# Patient Record
Sex: Female | Born: 1940 | Race: Black or African American | Hispanic: No | Marital: Single | Smoking: Current every day smoker
Health system: Southern US, Community
[De-identification: ages and names within clinical notes are randomized; demographics above are authoritative.]

## PROBLEM LIST (undated history)

## (undated) DIAGNOSIS — E1049 Type 1 diabetes mellitus with other diabetic neurological complication: Secondary | ICD-10-CM

## (undated) DIAGNOSIS — E1065 Type 1 diabetes mellitus with hyperglycemia: Secondary | ICD-10-CM

## (undated) DIAGNOSIS — I509 Heart failure, unspecified: Secondary | ICD-10-CM

## (undated) DIAGNOSIS — I10 Essential (primary) hypertension: Secondary | ICD-10-CM

## (undated) DIAGNOSIS — E039 Hypothyroidism, unspecified: Secondary | ICD-10-CM

## (undated) DIAGNOSIS — N183 Chronic kidney disease, stage 3 unspecified: Secondary | ICD-10-CM

## (undated) DIAGNOSIS — R609 Edema, unspecified: Secondary | ICD-10-CM

## (undated) DIAGNOSIS — I639 Cerebral infarction, unspecified: Secondary | ICD-10-CM

## (undated) DIAGNOSIS — F329 Major depressive disorder, single episode, unspecified: Secondary | ICD-10-CM

## (undated) DIAGNOSIS — D649 Anemia, unspecified: Secondary | ICD-10-CM

## (undated) DIAGNOSIS — K5900 Constipation, unspecified: Secondary | ICD-10-CM

## (undated) DIAGNOSIS — M810 Age-related osteoporosis without current pathological fracture: Secondary | ICD-10-CM

## (undated) DIAGNOSIS — F3289 Other specified depressive episodes: Secondary | ICD-10-CM

## (undated) DIAGNOSIS — G609 Hereditary and idiopathic neuropathy, unspecified: Secondary | ICD-10-CM

## (undated) DIAGNOSIS — I6990 Unspecified sequelae of unspecified cerebrovascular disease: Secondary | ICD-10-CM

## (undated) DIAGNOSIS — G8190 Hemiplegia, unspecified affecting unspecified side: Secondary | ICD-10-CM

## (undated) DIAGNOSIS — N289 Disorder of kidney and ureter, unspecified: Secondary | ICD-10-CM

## (undated) DIAGNOSIS — E11329 Type 2 diabetes mellitus with mild nonproliferative diabetic retinopathy without macular edema: Secondary | ICD-10-CM

## (undated) DIAGNOSIS — E042 Nontoxic multinodular goiter: Secondary | ICD-10-CM

## (undated) DIAGNOSIS — I110 Hypertensive heart disease with heart failure: Secondary | ICD-10-CM

## (undated) DIAGNOSIS — E559 Vitamin D deficiency, unspecified: Secondary | ICD-10-CM

## (undated) HISTORY — DX: Chronic kidney disease, stage 3 unspecified: N18.30

## (undated) HISTORY — DX: Anemia, unspecified: D64.9

## (undated) HISTORY — DX: Unspecified sequelae of unspecified cerebrovascular disease: I69.90

## (undated) HISTORY — DX: Other specified depressive episodes: F32.89

## (undated) HISTORY — DX: Type 1 diabetes mellitus with other diabetic neurological complication: E10.49

## (undated) HISTORY — DX: Vitamin D deficiency, unspecified: E55.9

## (undated) HISTORY — DX: Chronic kidney disease, stage 3 (moderate): N18.3

## (undated) HISTORY — PX: CEREBRAL ANEURYSM REPAIR: SHX164

## (undated) HISTORY — DX: Nontoxic multinodular goiter: E04.2

## (undated) HISTORY — DX: Hemiplegia, unspecified affecting unspecified side: G81.90

## (undated) HISTORY — DX: Disorder of kidney and ureter, unspecified: N28.9

## (undated) HISTORY — DX: Hypertensive heart disease with heart failure: I11.0

## (undated) HISTORY — DX: Hypothyroidism, unspecified: E03.9

## (undated) HISTORY — DX: Essential (primary) hypertension: I10

## (undated) HISTORY — DX: Heart failure, unspecified: I50.9

## (undated) HISTORY — DX: Age-related osteoporosis without current pathological fracture: M81.0

## (undated) HISTORY — DX: Type 2 diabetes mellitus with mild nonproliferative diabetic retinopathy without macular edema: E11.329

## (undated) HISTORY — DX: Edema, unspecified: R60.9

## (undated) HISTORY — DX: Hereditary and idiopathic neuropathy, unspecified: G60.9

## (undated) HISTORY — DX: Constipation, unspecified: K59.00

## (undated) HISTORY — DX: Type 1 diabetes mellitus with hyperglycemia: E10.65

## (undated) HISTORY — DX: Major depressive disorder, single episode, unspecified: F32.9

---

## 2008-05-19 ENCOUNTER — Emergency Department (HOSPITAL_COMMUNITY): Admission: EM | Admit: 2008-05-19 | Discharge: 2008-05-19 | Payer: Self-pay | Admitting: Emergency Medicine

## 2008-07-08 ENCOUNTER — Other Ambulatory Visit: Admission: RE | Admit: 2008-07-08 | Discharge: 2008-07-08 | Payer: Self-pay | Admitting: Interventional Radiology

## 2008-07-08 ENCOUNTER — Encounter (INDEPENDENT_AMBULATORY_CARE_PROVIDER_SITE_OTHER): Payer: Self-pay | Admitting: Interventional Radiology

## 2008-07-08 ENCOUNTER — Encounter: Admission: RE | Admit: 2008-07-08 | Discharge: 2008-07-08 | Payer: Self-pay | Admitting: Internal Medicine

## 2009-03-22 HISTORY — PX: EYE SURGERY: SHX253

## 2010-03-02 ENCOUNTER — Encounter
Admission: RE | Admit: 2010-03-02 | Discharge: 2010-03-02 | Payer: Self-pay | Source: Home / Self Care | Attending: Internal Medicine | Admitting: Internal Medicine

## 2010-03-15 ENCOUNTER — Encounter
Admission: RE | Admit: 2010-03-15 | Discharge: 2010-03-15 | Payer: Self-pay | Source: Home / Self Care | Attending: Internal Medicine | Admitting: Internal Medicine

## 2010-03-20 ENCOUNTER — Encounter: Payer: Self-pay | Admitting: Internal Medicine

## 2010-03-30 ENCOUNTER — Encounter: Payer: Self-pay | Admitting: Internal Medicine

## 2010-04-13 ENCOUNTER — Other Ambulatory Visit: Payer: Self-pay | Admitting: Internal Medicine

## 2010-04-13 DIAGNOSIS — E049 Nontoxic goiter, unspecified: Secondary | ICD-10-CM

## 2010-04-15 ENCOUNTER — Ambulatory Visit
Admission: RE | Admit: 2010-04-15 | Discharge: 2010-04-15 | Disposition: A | Discharge status: Home / Self Care | Payer: Medicaid Other | Source: Ambulatory Visit | Attending: Internal Medicine | Admitting: Internal Medicine

## 2010-04-15 DIAGNOSIS — E049 Nontoxic goiter, unspecified: Secondary | ICD-10-CM

## 2010-06-09 LAB — URINALYSIS, ROUTINE W REFLEX MICROSCOPIC
Glucose, UA: NEGATIVE mg/dL
Nitrite: POSITIVE — AB
pH: 6 (ref 5.0–8.0)

## 2010-06-09 LAB — BASIC METABOLIC PANEL
CO2: 28 mEq/L (ref 19–32)
Calcium: 8.9 mg/dL (ref 8.4–10.5)
Chloride: 104 mEq/L (ref 96–112)
Creatinine, Ser: 1.37 mg/dL — ABNORMAL HIGH (ref 0.4–1.2)
GFR calc Af Amer: 47 mL/min — ABNORMAL LOW (ref >60)
Sodium: 138 mEq/L (ref 135–145)

## 2010-06-09 LAB — DIFFERENTIAL
Basophils Relative: 0 % (ref 0–1)
Lymphs Abs: 0.8 10*3/uL (ref 0.7–4.0)
Monocytes Absolute: 0.4 10*3/uL (ref 0.1–1.0)
Monocytes Relative: 8 % (ref 3–12)
Neutro Abs: 3.9 10*3/uL (ref 1.7–7.7)
Neutrophils Relative %: 75 % (ref 43–77)

## 2010-06-09 LAB — CBC
Hemoglobin: 12.7 g/dL (ref 12.0–15.0)
MCHC: 32.4 g/dL (ref 30.0–36.0)
RBC: 4.99 MIL/uL (ref 3.87–5.11)
WBC: 5.2 10*3/uL (ref 4.0–10.5)

## 2010-06-09 LAB — POCT CARDIAC MARKERS
CKMB, poc: 1.7 ng/mL (ref 1.0–8.0)
Myoglobin, poc: 170 ng/mL (ref 12–200)
Troponin i, poc: <0.05 ng/mL (ref 0.00–0.09)

## 2010-06-09 LAB — URINE MICROSCOPIC-ADD ON

## 2010-06-09 LAB — URINE CULTURE

## 2011-07-11 ENCOUNTER — Other Ambulatory Visit: Payer: Self-pay | Admitting: Internal Medicine

## 2011-07-11 DIAGNOSIS — E049 Nontoxic goiter, unspecified: Secondary | ICD-10-CM

## 2011-07-14 ENCOUNTER — Ambulatory Visit
Admission: RE | Admit: 2011-07-14 | Discharge: 2011-07-14 | Disposition: A | Discharge status: Home / Self Care | Payer: No Typology Code available for payment source | Source: Ambulatory Visit | Attending: Internal Medicine | Admitting: Internal Medicine

## 2011-07-14 DIAGNOSIS — E049 Nontoxic goiter, unspecified: Secondary | ICD-10-CM

## 2012-05-31 DIAGNOSIS — N183 Chronic kidney disease, stage 3 (moderate): Secondary | ICD-10-CM

## 2012-05-31 DIAGNOSIS — E785 Hyperlipidemia, unspecified: Secondary | ICD-10-CM

## 2012-05-31 DIAGNOSIS — E1129 Type 2 diabetes mellitus with other diabetic kidney complication: Secondary | ICD-10-CM

## 2012-05-31 DIAGNOSIS — J449 Chronic obstructive pulmonary disease, unspecified: Secondary | ICD-10-CM

## 2012-06-28 DIAGNOSIS — E1159 Type 2 diabetes mellitus with other circulatory complications: Secondary | ICD-10-CM

## 2012-06-28 DIAGNOSIS — D508 Other iron deficiency anemias: Secondary | ICD-10-CM

## 2012-06-28 DIAGNOSIS — I798 Other disorders of arteries, arterioles and capillaries in diseases classified elsewhere: Secondary | ICD-10-CM

## 2012-08-01 DIAGNOSIS — I5030 Unspecified diastolic (congestive) heart failure: Secondary | ICD-10-CM

## 2012-08-01 DIAGNOSIS — N039 Chronic nephritic syndrome with unspecified morphologic changes: Secondary | ICD-10-CM

## 2012-08-01 DIAGNOSIS — I130 Hypertensive heart and chronic kidney disease with heart failure and stage 1 through stage 4 chronic kidney disease, or unspecified chronic kidney disease: Secondary | ICD-10-CM

## 2012-08-01 DIAGNOSIS — N183 Chronic kidney disease, stage 3 (moderate): Secondary | ICD-10-CM

## 2012-08-01 DIAGNOSIS — I509 Heart failure, unspecified: Secondary | ICD-10-CM

## 2012-09-04 ENCOUNTER — Encounter: Payer: Self-pay | Admitting: *Deleted

## 2012-09-27 ENCOUNTER — Encounter: Payer: Self-pay | Admitting: *Deleted

## 2012-10-04 ENCOUNTER — Encounter: Payer: Self-pay | Admitting: *Deleted

## 2012-10-04 ENCOUNTER — Non-Acute Institutional Stay (SKILLED_NURSING_FACILITY): Payer: PRIVATE HEALTH INSURANCE | Admitting: Internal Medicine

## 2012-10-04 DIAGNOSIS — I509 Heart failure, unspecified: Secondary | ICD-10-CM

## 2012-10-04 DIAGNOSIS — E1129 Type 2 diabetes mellitus with other diabetic kidney complication: Secondary | ICD-10-CM

## 2012-10-04 DIAGNOSIS — E1169 Type 2 diabetes mellitus with other specified complication: Secondary | ICD-10-CM | POA: Insufficient documentation

## 2012-10-04 DIAGNOSIS — D508 Other iron deficiency anemias: Secondary | ICD-10-CM | POA: Insufficient documentation

## 2012-10-04 DIAGNOSIS — N183 Chronic kidney disease, stage 3 unspecified: Secondary | ICD-10-CM | POA: Insufficient documentation

## 2012-10-04 DIAGNOSIS — I5030 Unspecified diastolic (congestive) heart failure: Secondary | ICD-10-CM | POA: Insufficient documentation

## 2012-10-04 DIAGNOSIS — I130 Hypertensive heart and chronic kidney disease with heart failure and stage 1 through stage 4 chronic kidney disease, or unspecified chronic kidney disease: Secondary | ICD-10-CM | POA: Insufficient documentation

## 2012-10-04 DIAGNOSIS — M810 Age-related osteoporosis without current pathological fracture: Secondary | ICD-10-CM

## 2012-10-04 NOTE — Progress Notes (Signed)
Patient ID: Mia Calhoun, female   DOB: [birth year], 72 y.o.   MRN: [id number]  [facility]  Chief Complaint  Patient presents with  . Medical Managment of Chronic Issues   Code status- full code  HPI 72 y/o patient is long term care resident seen today for routine follow up. She has CVA and expressive aphasia. She is at here baseline. Saw her eye doctor recently. No concerns from patient and staff. Has medication compliance  Unable to obtain ROS from patient  No Known Allergies  Past Medical History  Diagnosis Date  . Unspecified vitamin D deficiency   . 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 Congestive heart failure, unspecified   . Unspecified constipation   . Unspecified hypothyroidism   . 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 Edema   . Hemiplegia, unspecified, affecting unspecified side   . Osteoporosis, unspecified   . Unspecified disorder of kidney and ureter   . Unspecified hereditary and idiopathic peripheral neuropathy   . Depressive disorder, not elsewhere classified   . Unspecified essential hypertension   . Unspecified late effects of cerebrovascular disease   . Anemia, unspecified   . Nontoxic multinodular goiter   . CKD (chronic kidney disease) stage 3, GFR 30-59 ml/min    BP 119/60  Pulse 68  Temp(Src) 97 F (36.1 C)  Resp 18  SpO2 96%  Physical Exam  Constitutional: She appears well-developed and well-nourished. No distress.  Expressive aphasia  HENT:  Head: Normocephalic and atraumatic.  Nose: Nose normal.  Mouth/Throat: Oropharynx is clear and moist.  Eyes: EOM are normal. Pupils are equal, round, and reactive to light.  Neck: Normal range of motion. Neck supple. No JVD present.  Cardiovascular: Normal rate, regular rhythm and normal heart sounds.   Weak dp pulses   Pulmonary/Chest: Effort normal and breath sounds normal. No respiratory distress. She exhibits no tenderness.  Abdominal: Bowel sounds are normal. She exhibits no distension and no mass.  Musculoskeletal: She exhibits no edema.  Right sided hemiparesis, able to propel in wheelchair by left side  Lymphadenopathy:    She has no cervical adenopathy.  Neurological: She is alert.  Skin: Skin is warm and dry. She is not diaphoretic.  Psychiatric: She has a normal mood and affect. Her behavior is normal.   Labs and medication reviewed  Assessment/plan  Dm with renal involvement Monitor cbg, continue lantus and novolog with glucotrol, monitor a1c, also on lisinopril, asa  chf Currently stable. Continue b blocker, acei, aldactone, lasix, asa and statin.  ckd stage 3 With vascular problems, continue lisinopril and lasix, monitor bmp  Hypertension bp remains stable. Continue lisinopril and toprol xl with amlodipine and lasix, monitor bp and bmp  Iron def anemia Continue iron supplement  osteoporsosis Continue ca-vit d  Hypothyroidism Continue levothyroxine and monitor tsh

## 2012-11-18 ENCOUNTER — Non-Acute Institutional Stay (SKILLED_NURSING_FACILITY): Payer: PRIVATE HEALTH INSURANCE | Admitting: Internal Medicine

## 2012-11-18 DIAGNOSIS — I5030 Unspecified diastolic (congestive) heart failure: Secondary | ICD-10-CM

## 2012-11-18 DIAGNOSIS — I130 Hypertensive heart and chronic kidney disease with heart failure and stage 1 through stage 4 chronic kidney disease, or unspecified chronic kidney disease: Secondary | ICD-10-CM

## 2012-11-18 DIAGNOSIS — E1129 Type 2 diabetes mellitus with other diabetic kidney complication: Secondary | ICD-10-CM

## 2012-11-18 NOTE — Progress Notes (Signed)
Patient ID: Mia Calhoun, female   DOB: 04/12/1940, 72 y.o.   MRN: [id number]  [facility]  Chief Complaint  Patient presents with  . Medical Managment of Chronic Issues   No Known Allergies  Code status full code  HPI 72 y/o patient is long term care resident seen today for routine follow up. She has CVA and expressive aphasia. She is at here baseline. No concerns from patient and staff.  Unable to obtain ROS from patient due to expressive aphasia  Past Medical History  Diagnosis Date  . Unspecified vitamin D deficiency   . 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 Congestive heart failure, unspecified   . Unspecified constipation   . Unspecified hypothyroidism   . 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 Edema   . Hemiplegia, unspecified, affecting unspecified side   . Osteoporosis, unspecified   . Unspecified disorder of kidney and ureter   . Unspecified hereditary and idiopathic peripheral neuropathy   . Depressive disorder, not elsewhere classified   . Unspecified essential hypertension   . Unspecified late effects of cerebrovascular disease   . Anemia, unspecified   . Nontoxic multinodular goiter   . CKD (chronic kidney disease) stage 3, GFR 30-59 ml/min    Past Surgical History  Procedure Laterality Date  . Eye surgery Left 03/22/2009    h/o diabetic retinopathy   Medications reviewed  Physical Exam  Constitutional: She appears well-developed and well-nourished. No distress.  Expressive aphasia  HENT:   Head: Normocephalic and atraumatic.   Nose: Nose normal.   Mouth/Throat: Oropharynx is clear and moist.  Eyes: EOM are normal. Pupils are equal, round, and reactive to light.  Neck: Normal range of motion. Neck supple. No JVD present.  Cardiovascular: Normal rate, regular rhythm and normal heart  sounds.   Weak dp pulses  Pulmonary/Chest: Effort normal and breath sounds normal. No respiratory distress. She exhibits no tenderness.  Abdominal: Bowel sounds are normal. She exhibits no distension and no mass.  Musculoskeletal: She exhibits no edema.  Right sided hemiparesis, able to propel in wheelchair by left side  Lymphadenopathy:   She has no cervical adenopathy.  Neurological: She is alert.  Skin: Skin is warm and dry. She is not diaphoretic.  Psychiatric: She has a normal mood and affect. Her behavior is normal.   Labs- 11/04/12 wbc 9.4, hb 10.1, plt 272, na 140, k 4.8, bun 35, cr 1.6, glu 131, ca 9.1, tsh 0.883  Assessment/plan  Dm with renal involvement Monitor cbg, continue lantus and novolog. Off glucotrol. Continue aspirin, statin, ACEI. Last foot exam 2/14 and eye exam 2/14  chf Currently stable. Continue b blocker, acei, aldactone, lasix, asa and statin.  Hypothyroidism- continue levothyroxine, reviewed recent thyroid level  iron def anemia Continue iron supplement  Reflux disease Continue rpilosec for now, monitor clinically ckd stage 3 With vascular problems, continue lisinopril and lasix, monitor bmp  Hypertension bp remains stable. Continue lisinopril, amlodipine and lasix, monitor bp and bmp  osteoporsosis Continue ca-vit d

## 2012-12-16 ENCOUNTER — Encounter: Payer: Self-pay | Admitting: Internal Medicine

## 2012-12-16 ENCOUNTER — Non-Acute Institutional Stay (SKILLED_NURSING_FACILITY): Payer: PRIVATE HEALTH INSURANCE | Admitting: Internal Medicine

## 2012-12-16 DIAGNOSIS — I509 Heart failure, unspecified: Secondary | ICD-10-CM

## 2012-12-16 DIAGNOSIS — I5032 Chronic diastolic (congestive) heart failure: Secondary | ICD-10-CM

## 2012-12-16 DIAGNOSIS — E039 Hypothyroidism, unspecified: Secondary | ICD-10-CM

## 2012-12-16 DIAGNOSIS — K219 Gastro-esophageal reflux disease without esophagitis: Secondary | ICD-10-CM

## 2012-12-16 DIAGNOSIS — D638 Anemia in other chronic diseases classified elsewhere: Secondary | ICD-10-CM

## 2012-12-16 DIAGNOSIS — E1129 Type 2 diabetes mellitus with other diabetic kidney complication: Secondary | ICD-10-CM

## 2012-12-16 DIAGNOSIS — I5030 Unspecified diastolic (congestive) heart failure: Secondary | ICD-10-CM | POA: Insufficient documentation

## 2012-12-16 DIAGNOSIS — I130 Hypertensive heart and chronic kidney disease with heart failure and stage 1 through stage 4 chronic kidney disease, or unspecified chronic kidney disease: Secondary | ICD-10-CM

## 2012-12-16 DIAGNOSIS — N183 Chronic kidney disease, stage 3 unspecified: Secondary | ICD-10-CM

## 2012-12-16 NOTE — Progress Notes (Signed)
Patient ID: Mia Calhoun, female   DOB: [birth year], 72 y.o.   MRN: [id number]  [facility]    Chief Complaint   Patient presents with   .  Medical Managment of Chronic Issues    No Known Allergies  Code status full code  HPI 72 y/o patient is long term care resident seen today for routine follow up. She has CVA and expressive aphasia. She is at here baseline. No concerns from patient and staff.  continues to smoke 1-2 cigarettes a day. No falls reported. No new skin concerns Unable to obtain ROS from patient due to expressive aphasia  Past Medical History  Diagnosis Date  . Unspecified vitamin D deficiency   . 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 Congestive heart failure, unspecified   . Unspecified constipation   . Unspecified hypothyroidism   . 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 Edema   . Hemiplegia, unspecified, affecting unspecified side   . Osteoporosis, unspecified   . Unspecified disorder of kidney and ureter   . Unspecified hereditary and idiopathic peripheral neuropathy   . Depressive disorder, not elsewhere classified   . Unspecified essential hypertension   . Unspecified late effects of cerebrovascular disease   . Anemia, unspecified   . Nontoxic multinodular goiter   . CKD (chronic kidney disease) stage 3, GFR 30-59 ml/min    Past Surgical History  Procedure Laterality Date  . Eye surgery Left 03/22/2009    h/o diabetic retinopathy    Medications reviewed  Physical Exam   BP 122/68  Pulse 68  Temp(Src) 97.4 F (36.3 C)  Resp 18  SpO2 96%  Constitutional: She appears well-developed and well-nourished. No distress.  Expressive aphasia   HENT:   Head: Normocephalic and atraumatic.   Nose: Nose normal.   Mouth/Throat: Oropharynx is clear and moist.   Eyes: EOM are normal. Pupils  are equal, round, and reactive to light.   Neck: Normal range of motion. Neck supple. No JVD present.   Cardiovascular: Normal rate, regular rhythm and normal heart sounds.   Weak dp pulses   Pulmonary/Chest: Effort normal and breath sounds normal. No respiratory distress. She exhibits no tenderness.   Abdominal: Bowel sounds are normal. She exhibits no distension and no mass.  Musculoskeletal: She exhibits no edema.  Right sided hemiparesis, able to propel in wheelchair by left side  Lymphadenopathy:  She has no cervical adenopathy.  Neurological: She is alert.   Skin: Skin is warm and dry. She is not diaphoretic.  Psychiatric: She has a normal mood and affect. Her behavior is normal.   Labs- 11/04/12 wbc 9.4, hb 10.1, plt 272, na 140, k 4.8, bun 35, cr 1.6, glu 131, ca 9.1, tsh 0.883  Assessment/plan  Dm with renal involvement Monitor cbg, continue lantus 38 u qhs and novolog 8 u premeal. Continue aspirin, statin, ACEI. Last foot exam 2/14 and eye exam 2/14. Monitor a1c  chf Currently stable. Continue toprol xl 50 mg daily, lisinopril 2.5 mg daily, aldactone 25 mg daily, lasix 20 mg daily, asa and statin. Monitor weight. Currently euvolemic  Hypertension bp remains stable. Continue lisinopril, amlodipine and lasix, monitor bp and bmp  Hypothyroidism continue levothyroxine, reviewed recent thyroid level  ckd stage 3 With vascular problems, continue lisinopril and lasix, monitor bmp  anemia Likely in setting of ckd and DM. Continue iron supplement  Reflux disease Continue  rpilosec for now, monitor clinically  osteoporsosis Continue ca-vit d

## 2013-01-30 ENCOUNTER — Non-Acute Institutional Stay (SKILLED_NURSING_FACILITY): Payer: PRIVATE HEALTH INSURANCE | Admitting: Internal Medicine

## 2013-01-30 DIAGNOSIS — D508 Other iron deficiency anemias: Secondary | ICD-10-CM

## 2013-01-30 DIAGNOSIS — M810 Age-related osteoporosis without current pathological fracture: Secondary | ICD-10-CM

## 2013-01-30 DIAGNOSIS — I509 Heart failure, unspecified: Secondary | ICD-10-CM

## 2013-01-30 DIAGNOSIS — E1129 Type 2 diabetes mellitus with other diabetic kidney complication: Secondary | ICD-10-CM

## 2013-01-30 DIAGNOSIS — I5030 Unspecified diastolic (congestive) heart failure: Secondary | ICD-10-CM

## 2013-01-30 DIAGNOSIS — N183 Chronic kidney disease, stage 3 unspecified: Secondary | ICD-10-CM

## 2013-01-30 NOTE — Progress Notes (Signed)
Patient ID: Mia Calhoun, female   DOB: 19-Jun-1940, 72 y.o.   MRN: [id number]  [facility]    Chief Complaint    Patient presents with    .   Medical Managment of Chronic Issues     No Known Allergies  Code status full code  HPI 72 y/o patient is long term care resident seen today for routine follow up. She has CVA and expressive aphasia. She is at here baseline. No concerns from patient and staff.  continues to smoke 1-2 cigarettes a day. No falls reported. No new skin concerns Unable to obtain ROS from patient due to expressive aphasia  Reviewed her pm, psh and social hx- no new changes  Medication reviewed. See [facility]  Physical exam BP 110/60  Pulse 73  Temp(Src) 97 F (36.1 C)  Resp 16  Constitutional: She appears well-developed and well-nourished. No distress.  Expressive aphasia   HENT:   Head: Normocephalic and atraumatic.   Nose: Nose normal.   Mouth/Throat: Oropharynx is clear and moist.   Eyes: EOM are normal. Pupils are equal, round, and reactive to light.   Neck: Normal range of motion. Neck supple. No JVD present.   Cardiovascular: Normal rate, regular rhythm and normal heart sounds.   Weak dp pulses   Pulmonary/Chest: Effort normal and breath sounds normal. No respiratory distress. She exhibits no tenderness.   Abdominal: Bowel sounds are normal. She exhibits no distension and no mass.  Musculoskeletal: She exhibits no edema.  Right sided hemiparesis, able to propel in wheelchair by left side  Lymphadenopathy:  She has no cervical adenopathy.  Neurological: She is alert.   Skin: Skin is warm and dry. She is not diaphoretic.   Psychiatric: She has a normal mood and affect. Her behavior is normal.   Labs- 11/04/12 wbc 9.4, hb 10.1, plt 272, na 140, k 4.8, bun 35, cr 1.6, glu 131, ca 9.1, tsh 0.883  Assessment/plan  Dm with renal involvement Monitor cbg, continue lantus 38 u qhs and novolog 8-15 u premeal. Continue aspirin, statin,  ACEI. Last foot exam 2/14 and eye exam 2/14. Check a1c  chf Currently stable. Continue toprol xl 50 mg daily, lisinopril 2.5 mg daily, aldactone 25 mg daily, lasix 20 mg daily, asa and statin. Monitor weight. Currently euvolemic  Hypertension bp remains stable. Continue lisinopril, amlodipine and lasix, monitor bp and bmp  Hypothyroidism continue levothyroxine, symptoms under control  ckd stage 3 With vascular problems, continue lisinopril and lasix, monitor bmp  anemia Likely in setting of ckd and DM. Continue iron supplement. Monitor h/h  Reflux disease Continue prilosec for now, monitor clinically  osteoporsosis Continue ca-vit d

## 2013-03-28 ENCOUNTER — Encounter: Payer: Self-pay | Admitting: Internal Medicine

## 2013-03-28 ENCOUNTER — Non-Acute Institutional Stay (SKILLED_NURSING_FACILITY): Payer: PRIVATE HEALTH INSURANCE | Admitting: Internal Medicine

## 2013-03-28 DIAGNOSIS — IMO0002 Reserved for concepts with insufficient information to code with codable children: Secondary | ICD-10-CM | POA: Insufficient documentation

## 2013-03-28 DIAGNOSIS — E1129 Type 2 diabetes mellitus with other diabetic kidney complication: Secondary | ICD-10-CM

## 2013-03-28 DIAGNOSIS — I10 Essential (primary) hypertension: Secondary | ICD-10-CM

## 2013-03-28 DIAGNOSIS — E785 Hyperlipidemia, unspecified: Secondary | ICD-10-CM

## 2013-03-28 DIAGNOSIS — K219 Gastro-esophageal reflux disease without esophagitis: Secondary | ICD-10-CM

## 2013-03-28 DIAGNOSIS — E1165 Type 2 diabetes mellitus with hyperglycemia: Principal | ICD-10-CM

## 2013-03-28 DIAGNOSIS — E039 Hypothyroidism, unspecified: Secondary | ICD-10-CM | POA: Insufficient documentation

## 2013-03-28 DIAGNOSIS — D638 Anemia in other chronic diseases classified elsewhere: Secondary | ICD-10-CM

## 2013-03-28 NOTE — Progress Notes (Signed)
Patient ID: Mia Calhoun, female   DOB: 03/07/1940, [age] yRaif[age] Simmonds.o.   MRN: [id number]    [facility]  Cc- routine visit  No Known Allergies  Code status full code  HPI 73 y/o patient is long term care resident seen today for routine follow up. She has CVA and expressive aphasia with right hemiparesis. She is at here baseline. No concerns from patient and staff.  continues to smoke 1-2 cigarettes a day. No falls reported. No new skin concerns Unable to obtain ROS from patient due to expressive aphasia  Reviewed her pmh, psh and social hx- no new changes  Medication reviewed. [facility]  Physical exam BP 114/60  Pulse 80  Temp(Src) 97.2 F (36.2 C)  Resp 18  SpO2 96%   Constitutional: She appears well-developed and well-nourished. No distress.  Expressive aphasia   HENT:   Head: Normocephalic and atraumatic.   Nose: Nose normal.   Mouth/Throat: Oropharynx is clear and moist.   Eyes: EOM are normal. Pupils are equal, round, and reactive to light.   Neck: Normal range of motion. Neck supple. No JVD present.   Cardiovascular: Normal rate, regular rhythm and normal heart sounds.   Weak dp pulses   Pulmonary/Chest: Effort normal and breath sounds normal. No respiratory distress. She exhibits no tenderness.   Abdominal: Bowel sounds are normal. She exhibits no distension and no mass.  Musculoskeletal: She exhibits no edema.  Right sided hemiparesis, able to propel in wheelchair by left side  Lymphadenopathy:  She has no cervical adenopathy.  Neurological: She is alert.   Skin: Skin is warm and dry. She is not diaphoretic.   Psychiatric: She has a normal mood and affect. Her behavior is normal.   Labs- 11/04/12 wbc 9.4, hb 10.1, plt 272, na 140, k 4.8, bun 35, cr 1.6, glu 131, ca 9.1, tsh 0.883 02/06/13 a1c 7.8 01/28/13 na 136, k 5.3, glu 230, bun 38, cr 1.7, lft wnl, t.chol 151, hdl 29, ldl 97, tg 124  Assessment/plan  Dm with renal involvement Monitor cbg,  reviewed a1c. continue lantus 38 u qhs and novolog 8-15 u premeal. Continue aspirin, statin, ACEI. Last foot exam 2/14 and eye exam 2/14.  Hyperlipidemia ldl at goal. continue zocor 5 mg daily  Reflux disease Continue prilosec for now but decrease to 10 mg daily, monitor clinically  anemia Likely in setting of ckd and DM. Continue iron supplement. Monitor h/h  chf Currently stable. Continue toprol xl 50 mg daily, lisinopril 2.5 mg daily, aldactone 25 mg daily, lasix 20 mg daily, asa and statin. Monitor weight. Currently euvolemic  Hypertension bp remains stable- in fact well controlled. Continue lisinopril and lasix, d/c amlodipine. monitor bp and bmp  Hypothyroidism Reviewed tsh from 9/14. It is below 1. Recheck tsh and if level remains < 1, decrease levothyroxine to 25 mcg daily.   ckd stage 3 With vascular problems, continue lisinopril and lasix, monitor bmp  osteoporsosis Continue ca-vit d  Labs- tsh

## 2013-04-21 ENCOUNTER — Encounter: Payer: Self-pay | Admitting: Internal Medicine

## 2013-04-21 ENCOUNTER — Non-Acute Institutional Stay (SKILLED_NURSING_FACILITY): Payer: PRIVATE HEALTH INSURANCE | Admitting: Internal Medicine

## 2013-04-21 DIAGNOSIS — E1159 Type 2 diabetes mellitus with other circulatory complications: Secondary | ICD-10-CM

## 2013-04-21 DIAGNOSIS — N183 Chronic kidney disease, stage 3 unspecified: Secondary | ICD-10-CM

## 2013-04-21 DIAGNOSIS — J449 Chronic obstructive pulmonary disease, unspecified: Secondary | ICD-10-CM

## 2013-04-21 DIAGNOSIS — I798 Other disorders of arteries, arterioles and capillaries in diseases classified elsewhere: Secondary | ICD-10-CM

## 2013-04-21 DIAGNOSIS — E11349 Type 2 diabetes mellitus with severe nonproliferative diabetic retinopathy without macular edema: Secondary | ICD-10-CM

## 2013-04-21 NOTE — Progress Notes (Signed)
Patient ID: Raiford Simmondsorothy A Minor, female   DOB: 11/18/1940, 73 y.o.   MRN: [id number]    No Known Allergies  Code status full code  HPI 73 y/o patient is long term care resident seen today for routine follow up. She has CVA and expressive aphasia. She is at here baseline. No concerns from patient and staff.  continues to smoke 1-2 cigarettes a day. No falls reported. No new skin concerns. No new behavior concerns  ROS Unable to obtain ROS from patient due to expressive aphasia  Past Medical History  Diagnosis Date  . Unspecified vitamin D deficiency   . 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 Congestive heart failure, unspecified   . Unspecified constipation   . Unspecified hypothyroidism   . 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 Type I (juvenile type) diabetes mellitus with neurological manifestations, uncontrolled   . Edema   . Hemiplegia, unspecified, affecting unspecified side   . Osteoporosis, unspecified   . Unspecified disorder of kidney and ureter   . Unspecified hereditary and idiopathic peripheral neuropathy   . Depressive disorder, not elsewhere classified   . Unspecified essential hypertension   . Unspecified late effects of cerebrovascular disease   . Anemia, unspecified   . Nontoxic multinodular goiter   . CKD (chronic kidney disease) stage 3, GFR 30-59 ml/min     Past Surgical History  Procedure Laterality Date  . Eye surgery Left 03/22/2009    h/o diabetic retinopathy    [facility]   Physical exam BP 118/65  Pulse 75  Temp(Src) 97 F (36.1 C)  Resp 18  SpO2 96%  Constitutional: She appears well-developed and well-nourished. No distress. Expressive aphasia   HENT:   Head: Normocephalic and atraumatic.   Nose: Nose normal.   Mouth/Throat: Oropharynx is clear and moist.   Eyes: EOM are normal. Pupils are equal, round, and reactive to light.   Neck: Normal range of motion. Neck supple. No JVD  present.   Cardiovascular: Normal rate, regular rhythm and normal heart sounds. Weak dp pulses   Pulmonary/Chest: Effort normal and breath sounds normal. No respiratory distress. She exhibits no tenderness.   Abdominal: Bowel sounds are normal. She exhibits no distension and no mass.  Musculoskeletal: She exhibits no edema. Right sided hemiparesis, able to propel in wheelchair by left side  Lymphadenopathy: She has no cervical adenopathy.  Neurological: She is alert.   Skin: Skin is warm and dry. She is not diaphoretic.   Psychiatric: She has a normal mood and affect. Her behavior is normal.   Labs- 11/04/12 wbc 9.4, hb 10.1, plt 272, na 140, k 4.8, bun 35, cr 1.6, glu 131, ca 9.1, tsh 0.883   Assessment/plan  Dm with renal involvement Monitor cbg, continue lantus 38 u qhs and novolog 8-15 u premeal. Continue aspirin, statin, ACEI. Uptodate with eye exam (Dr Luciana Axeankin)  Peripheral vascular disease Stable. Continue aspirin  COPD From her smoking. No worsening symptoms at present. Stab;e  ckd stage 3 With vascular problems, continue lisinopril and lasix, monitor bmp  Diabetic retinopathy Followed by dr Luciana Axerankin. Monitor cbg for controlling of dm   Oneal GroutMAHIMA Kiyonna Tortorelli, MD  [facility] [phone] (Monday-Friday 8 am - 5 pm) [phone] (afterhours)

## 2013-04-30 DIAGNOSIS — J4489 Other specified chronic obstructive pulmonary disease: Secondary | ICD-10-CM | POA: Insufficient documentation

## 2013-04-30 DIAGNOSIS — E11349 Type 2 diabetes mellitus with severe nonproliferative diabetic retinopathy without macular edema: Secondary | ICD-10-CM | POA: Insufficient documentation

## 2013-04-30 DIAGNOSIS — J449 Chronic obstructive pulmonary disease, unspecified: Secondary | ICD-10-CM | POA: Insufficient documentation

## 2013-04-30 DIAGNOSIS — E113499 Type 2 diabetes mellitus with severe nonproliferative diabetic retinopathy without macular edema, unspecified eye: Secondary | ICD-10-CM | POA: Insufficient documentation

## 2013-04-30 DIAGNOSIS — I798 Other disorders of arteries, arterioles and capillaries in diseases classified elsewhere: Secondary | ICD-10-CM | POA: Insufficient documentation

## 2013-04-30 DIAGNOSIS — E1159 Type 2 diabetes mellitus with other circulatory complications: Secondary | ICD-10-CM | POA: Insufficient documentation

## 2013-05-20 ENCOUNTER — Other Ambulatory Visit: Payer: Self-pay | Admitting: Internal Medicine

## 2013-05-20 DIAGNOSIS — E049 Nontoxic goiter, unspecified: Secondary | ICD-10-CM

## 2013-05-26 ENCOUNTER — Non-Acute Institutional Stay (SKILLED_NURSING_FACILITY): Payer: PRIVATE HEALTH INSURANCE | Admitting: Internal Medicine

## 2013-05-26 DIAGNOSIS — E1129 Type 2 diabetes mellitus with other diabetic kidney complication: Secondary | ICD-10-CM

## 2013-05-26 DIAGNOSIS — N183 Chronic kidney disease, stage 3 unspecified: Secondary | ICD-10-CM

## 2013-05-26 DIAGNOSIS — D508 Other iron deficiency anemias: Secondary | ICD-10-CM

## 2013-05-26 DIAGNOSIS — E042 Nontoxic multinodular goiter: Secondary | ICD-10-CM

## 2013-05-26 NOTE — Progress Notes (Signed)
Patient ID: Mia Calhoun, female   DOB: 05-Dec-1940, 73 y.o.   MRN: [id number]    [facility]  Chief Complaint  Patient presents with  . Medical Managment of Chronic Issues    RV   No Known Allergies  HPI 73 y/o patient with history of cva and aphasia is seen for routine follow up. She is at here baseline. No concerns from patient and staff.  continues to smoke 1-2 cigarettes a day. No falls reported. No new skin concerns. No new behavior concerns  ROS Unable to obtain ROS from patient due to expressive aphasia  Past Medical History  Diagnosis Date  . Unspecified vitamin D deficiency   . 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 Congestive heart failure, unspecified   . Unspecified constipation   . Unspecified hypothyroidism   . 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 Type I (juvenile type) diabetes mellitus with neurological manifestations, uncontrolled   . Edema   . Hemiplegia, unspecified, affecting unspecified side   . Osteoporosis, unspecified   . Unspecified disorder of kidney and ureter   . Unspecified hereditary and idiopathic peripheral neuropathy   . Depressive disorder, not elsewhere classified   . Unspecified essential hypertension   . Unspecified late effects of cerebrovascular disease   . Anemia, unspecified   . Nontoxic multinodular goiter   . CKD (chronic kidney disease) stage 3, GFR 30-59 ml/min    Past Surgical History  Procedure Laterality Date  . Eye surgery Left 03/22/2009    h/o diabetic retinopathy   Current Outpatient Prescriptions on File Prior to Visit  Medication Sig Dispense Refill  . amLODipine (NORVASC) 5 MG tablet Take 5 mg by mouth daily. Take 1 tablet daily for HTN.      Marland Kitchen aspirin 81 MG tablet Take 81 mg by mouth daily. Take 1 tablet daily to prevent heart attack and stroke.      . calcium-vitamin D (OSCAL 500/200 D-3) 500-200 MG-UNIT per tablet Take 1 tablet by mouth 3  (three) times daily. Take 1 tablet three times daily.      . furosemide (LASIX) 20 MG tablet Take 20 mg by mouth daily. Take 1 tablet daily for edema.      Marland Kitchen glipiZIDE (GLUCOTROL XL) 5 MG 24 hr tablet Take 5 mg by mouth daily. Take 1 tablet by mouth daily for diabetes.      . insulin aspart protamine- aspart (NOVOLOG MIX 70/30) (70-30) 100 UNIT/ML injection Inject 8 Units into the skin 3 (three) times daily with meals. Inject 8 units sub Q before breakfast, lunch and dinner if CBG>180. If CBG <250 give 11 units sub Q.      . insulin glargine (LANTUS) 100 UNIT/ML injection Inject 44 Units into the skin at bedtime.      . iron polysaccharides (NIFEREX) 150 MG capsule Take 150 mg by mouth daily. Take 1 tablet daily for anemia.      Marland Kitchen levothyroxine (SYNTHROID, LEVOTHROID) 50 MCG tablet Take 50 mcg by mouth daily before breakfast. Take 1 tablet daily for thyroid therapy.      Marland Kitchen lisinopril (PRINIVIL,ZESTRIL) 2.5 MG tablet Take 2.5 mg by mouth daily. Take 1 tablet by mouth for HTN /renal protection.      . metoprolol succinate (TOPROL-XL) 50 MG 24 hr tablet Take 50 mg by mouth daily. Take 1 tablet by mouth daily for HTN,  with or immediately following a meal.      . omeprazole (  PRILOSEC) 20 MG capsule Take 20 mg by mouth daily. Take 1 capsule by mouth daily for GERD/GI protection.      . sennosides-docusate sodium (SENOKOT-S) 8.6-50 MG tablet Take 1 tablet by mouth 2 (two) times daily. Take 1 tablet twice daily for constipation.      . simvastatin (ZOCOR) 5 MG tablet Take 5 mg by mouth at bedtime. Take 1 tablet by mouth every night at bedtime for hyperlipidemia.      Marland Kitchen. spironolactone (ALDACTONE) 25 MG tablet Take 25 mg by mouth daily.       No current facility-administered medications on file prior to visit.   Physical exam BP 130/68  Pulse 78  Temp(Src) 97.8 F (36.6 C)  Resp 18  SpO2 95%  Constitutional: She appears well-developed and well-nourished. No distress. Expressive aphasia   HENT:     Head: Normocephalic and atraumatic.   Nose: Nose normal.   Mouth/Throat: Oropharynx is clear and moist.   Eyes: EOM are normal. Pupils are equal, round, and reactive to light.   Neck: Normal range of motion. Neck supple. No JVD present.   Cardiovascular: Normal rate, regular rhythm and normal heart sounds. Weak dp pulses   Pulmonary/Chest: Effort normal and breath sounds normal. No respiratory distress. She exhibits no tenderness.   Abdominal: Bowel sounds are normal. She exhibits no distension and no mass.  Musculoskeletal: She exhibits no edema. Right sided hemiparesis, able to propel in wheelchair by left side  Lymphadenopathy: She has no cervical adenopathy.  Neurological: She is alert.   Skin: Skin is warm and dry. She is not diaphoretic.   Psychiatric: She has a normal mood and affect. Her behavior is normal.   Labs- 11/04/12 wbc 9.4, hb 10.1, plt 272, na 140, k 4.8, bun 35, cr 1.6, glu 131, ca 9.1, tsh 0.883 05/07/13 fe 29, tibc 214, ferritin 172 05/06/13 wbc 10.7, hb 10.5, hct 34, mcv 73.8, plt 330, na 134, k 4.6, cl 100, co2 29, bun 39, cr 1.9, glu 198, ca 8.6, lft wnl, tsh 0.9390, a1c 8  Assessment/plan  Dm with renal involvement a1c reviewed. Monitor cbg, continue lantus 38 u qhs and novolog 8-15 u premeal. Continue aspirin, statin, ACEI. Uptodate with eye exam (Dr Luciana Axeankin)  Iron def anemia Continue iron supplement. Also on prilosec. Stable hemoglobin  Peripheral vascular disease Stable. Continue aspirin  Multinodular goiter Stable. Reviewed tsh. Continue levothyroxine 50 mcg daily  ckd stage 3 With vascular problems, continue lisinopril and lasix, monitor bmp

## 2013-05-30 ENCOUNTER — Ambulatory Visit (HOSPITAL_COMMUNITY)
Admission: RE | Admit: 2013-05-30 | Discharge: 2013-05-30 | Disposition: A | Discharge status: Home / Self Care | Payer: PRIVATE HEALTH INSURANCE | Source: Ambulatory Visit | Attending: Internal Medicine | Admitting: Internal Medicine

## 2013-05-30 DIAGNOSIS — E049 Nontoxic goiter, unspecified: Secondary | ICD-10-CM

## 2013-05-30 DIAGNOSIS — E042 Nontoxic multinodular goiter: Secondary | ICD-10-CM | POA: Insufficient documentation

## 2013-05-30 DIAGNOSIS — E1129 Type 2 diabetes mellitus with other diabetic kidney complication: Secondary | ICD-10-CM | POA: Insufficient documentation

## 2013-06-23 ENCOUNTER — Non-Acute Institutional Stay (SKILLED_NURSING_FACILITY): Payer: PRIVATE HEALTH INSURANCE | Admitting: Internal Medicine

## 2013-06-23 DIAGNOSIS — I5030 Unspecified diastolic (congestive) heart failure: Secondary | ICD-10-CM

## 2013-06-23 DIAGNOSIS — N039 Chronic nephritic syndrome with unspecified morphologic changes: Principal | ICD-10-CM

## 2013-06-23 DIAGNOSIS — I509 Heart failure, unspecified: Principal | ICD-10-CM

## 2013-06-23 DIAGNOSIS — I130 Hypertensive heart and chronic kidney disease with heart failure and stage 1 through stage 4 chronic kidney disease, or unspecified chronic kidney disease: Secondary | ICD-10-CM

## 2013-06-23 DIAGNOSIS — E042 Nontoxic multinodular goiter: Secondary | ICD-10-CM

## 2013-06-23 NOTE — Progress Notes (Signed)
Patient ID: Mia Calhoun, female   DOB: 07/28/1940, 73 y.o.   MRN: [id number]    No Known Allergies  Code status full code  HPI 73 y/o patient is long term care resident seen today for routine follow up. 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 No falls reported. No new skin concerns. No new behavior concerns. cbg reviewed. Weight stable.  ROS Unable to obtain ROS from patient due to expressive aphasia  Past Medical History  Diagnosis Date  . Unspecified vitamin D deficiency   . 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 Congestive heart failure, unspecified   . Unspecified constipation   . Unspecified hypothyroidism   . 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 Type I (juvenile type) diabetes mellitus with neurological manifestations, uncontrolled   . Edema   . Hemiplegia, unspecified, affecting unspecified side   . Osteoporosis, unspecified   . Unspecified disorder of kidney and ureter   . Unspecified hereditary and idiopathic peripheral neuropathy   . Depressive disorder, not elsewhere classified   . Unspecified essential hypertension   . Unspecified late effects of cerebrovascular disease   . Anemia, unspecified   . Nontoxic multinodular goiter   . CKD (chronic kidney disease) stage 3, GFR 30-59 ml/min    Medication reviewed. [facility]  Physical exam BP 142/87  Pulse 88  Temp(Src) 98 F (36.7 C)  Resp 16  SpO2 94%  Constitutional: She appears well-developed and well-nourished. No distress. Expressive aphasia   Neck: Normal range of motion. Neck supple. No JVD present.   Cardiovascular: Normal rate, regular rhythm and normal heart sounds. Weak dp pulses   Pulmonary/Chest: Effort normal and breath sounds normal. No respiratory distress. She exhibits no tenderness.   Abdominal: Bowel sounds are normal. She exhibits no distension and no mass.    Musculoskeletal: She exhibits no edema. Right sided hemiparesis, able to propel in wheelchair by left side  Neurological: She is alert.   Skin: Skin is warm and dry. She is not diaphoretic.   Psychiatric: She has a normal mood and affect. Her behavior is normal.   Labs- 11/04/12 wbc 9.4, hb 10.1, plt 272, na 140, k 4.8, bun 35, cr 1.6, glu 131, ca 9.1, tsh 0.883 05/06/13 tsh 0.93, na 134, k 4.6, bun 39, cr 1.9, glu 198, wbc 10.7, hb 10.5, hct 34, plt 330   Assessment/plan  HTN Controlled mostly with metoprolol, lisinopril, aldactone and lasix. Monitor bp readings and renal function  chf On lasix and aldactone with b blocker and ACEI. euvolemic at present. Continue to smoke. Continue aspirin and statin  MNG Stable, continue levothyroxine 50 mcg daily

## 2013-07-07 ENCOUNTER — Non-Acute Institutional Stay (SKILLED_NURSING_FACILITY): Payer: PRIVATE HEALTH INSURANCE | Admitting: Internal Medicine

## 2013-07-07 DIAGNOSIS — E038 Other specified hypothyroidism: Secondary | ICD-10-CM

## 2013-07-07 DIAGNOSIS — I798 Other disorders of arteries, arterioles and capillaries in diseases classified elsewhere: Secondary | ICD-10-CM

## 2013-07-07 DIAGNOSIS — N183 Chronic kidney disease, stage 3 unspecified: Secondary | ICD-10-CM

## 2013-08-18 ENCOUNTER — Encounter: Payer: Self-pay | Admitting: Internal Medicine

## 2013-08-18 ENCOUNTER — Non-Acute Institutional Stay (SKILLED_NURSING_FACILITY): Payer: PRIVATE HEALTH INSURANCE | Admitting: Internal Medicine

## 2013-08-18 DIAGNOSIS — M546 Pain in thoracic spine: Secondary | ICD-10-CM

## 2013-08-18 DIAGNOSIS — E1165 Type 2 diabetes mellitus with hyperglycemia: Secondary | ICD-10-CM

## 2013-08-18 DIAGNOSIS — E118 Type 2 diabetes mellitus with unspecified complications: Principal | ICD-10-CM

## 2013-08-18 DIAGNOSIS — IMO0002 Reserved for concepts with insufficient information to code with codable children: Secondary | ICD-10-CM

## 2013-08-18 DIAGNOSIS — I69959 Hemiplegia and hemiparesis following unspecified cerebrovascular disease affecting unspecified side: Secondary | ICD-10-CM

## 2013-08-18 DIAGNOSIS — I69351 Hemiplegia and hemiparesis following cerebral infarction affecting right dominant side: Secondary | ICD-10-CM

## 2013-08-18 DIAGNOSIS — I10 Essential (primary) hypertension: Secondary | ICD-10-CM

## 2013-08-18 NOTE — Progress Notes (Signed)
Patient ID: Mia Calhoun, female   DOB: 06/17/1940, 73 y.o.   MRN: [id number]    [facility]  Chief Complaint  Patient presents with  . Medical Management of Chronic Issues    DM2, HTN, CVA with R hemiparesis, back pain   No Known Allergies  Code Status: Full Code  HPI: 73 y/o patient with history of CVA and aphasia, DM2, HTN among others is seen for routine visit. She points to her back when I ask her about pain. No recent falls reported. No new skin concerns. No new behavior concerns. No concerns from staff. continues to smoke  ROS Unable to obtain ROS from patient due to expressive aphasia. She expresses having pain in her back.   Past Medical History  Diagnosis Date  . Unspecified vitamin D deficiency   . 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 Congestive heart failure, unspecified   . Unspecified constipation   . Unspecified hypothyroidism   . 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 Type I (juvenile type) diabetes mellitus with neurological manifestations, uncontrolled   . Edema   . Hemiplegia, unspecified, affecting unspecified side   . Osteoporosis, unspecified   . Unspecified disorder of kidney and ureter   . Unspecified hereditary and idiopathic peripheral neuropathy   . Depressive disorder, not elsewhere classified   . Unspecified essential hypertension   . Unspecified late effects of cerebrovascular disease   . Anemia, unspecified   . Nontoxic multinodular goiter   . CKD (chronic kidney disease) stage 3, GFR 30-59 ml/min    Past Surgical History  Procedure Laterality Date  . Eye surgery Left 03/22/2009    h/o diabetic retinopathy   Outpatient Encounter Prescriptions as of 08/18/2013  Medication Sig  . cholecalciferol (VITAMIN D) 1000 UNITS tablet Take 2,000 Units by mouth daily.  . Multiple Vitamins-Minerals (CERTAGEN PO) Take 1 tablet by mouth daily.  Marland Kitchen. aspirin 81 MG tablet Take 81 mg by  mouth daily. Take 1 tablet daily to prevent heart attack and stroke.  . furosemide (LASIX) 20 MG tablet Take 20 mg by mouth daily. Take 1 tablet daily for edema.  . insulin aspart protamine- aspart (NOVOLOG MIX 70/30) (70-30) 100 UNIT/ML injection Inject 8 Units into the skin 3 (three) times daily with meals. Inject 8 units sub Q before breakfast, lunch and dinner if CBG>180. If CBG >250 give 11 units sub Q. If CBG>350 give 15 units SQ  . insulin glargine (LANTUS) 100 UNIT/ML injection Inject 38 Units into the skin at bedtime.   . iron polysaccharides (NIFEREX) 150 MG capsule Take 150 mg by mouth daily. Take 1 tablet daily for anemia.  Marland Kitchen. levothyroxine (SYNTHROID, LEVOTHROID) 50 MCG tablet Take 50 mcg by mouth daily before breakfast. Take 1 tablet daily for thyroid therapy.  Marland Kitchen. lisinopril (PRINIVIL,ZESTRIL) 2.5 MG tablet Take 2.5 mg by mouth daily. Take 1 tablet by mouth for HTN /renal protection.  . metoprolol succinate (TOPROL-XL) 50 MG 24 hr tablet Take 50 mg by mouth daily. Take 1 tablet by mouth daily for HTN,  with or immediately following a meal.  . omeprazole (PRILOSEC) 20 MG capsule Take 20 mg by mouth daily. Take 1 capsule by mouth daily for GERD/GI protection.  . sennosides-docusate sodium (SENOKOT-S) 8.6-50 MG tablet Take 1 tablet by mouth 2 (two) times daily. Take 1 tablet twice daily for constipation.  . simvastatin (ZOCOR) 5 MG tablet Take 5 mg by mouth at bedtime. Take 1 tablet by  mouth every night at bedtime for hyperlipidemia.  Marland Kitchen. spironolactone (ALDACTONE) 25 MG tablet Take 25 mg by mouth daily.  . [DISCONTINUED] amLODipine (NORVASC) 5 MG tablet Take 5 mg by mouth daily. Take 1 tablet daily for HTN.  . [DISCONTINUED] calcium-vitamin D (OSCAL 500/200 D-3) 500-200 MG-UNIT per tablet Take 1 tablet by mouth 3 (three) times daily. Take 1 tablet three times daily.  . [DISCONTINUED] glipiZIDE (GLUCOTROL XL) 5 MG 24 hr tablet Take 5 mg by mouth daily. Take 1 tablet by mouth daily for diabetes.      Physical Exam:  BP 135/60  Pulse 72  Temp(Src) 98.6 F (37 C)  Resp 16  Ht 5\' 7"  (1.702 m)  Wt 198 lb 12.8 oz (90.175 kg)  BMI 31.13 kg/m2  Constitutional: well-developed and well-nourished female in no apparent distress. Has expressive aphasia    Head: Normocephalic and atraumatic.   Mouth/Throat: Oropharynx is clear and moist. Poor dentition  Eyes: EOM intact. Pupils are equal, round, and reactive to light.   Neck: Normal range of motion. Neck supple. No JVD present.  No carotid bruit Cardiovascular: Normal rate, regular rhythm. No murmurs, rubs, or gallops.  Intact distal pulses   Pulmonary/Chest: Effort normal and breath sounds normal. No respiratory distress.    Abdominal: Bowel sounds are normal. She exhibits no distension and no mass.  Musculoskeletal: She exhibits no edema. Right sided hemiparesis, able to propel in wheelchair by left side.  Lymphadenopathy: She has no cervical adenopathy.  Neurological: She is alert.   Skin: Skin is warm and dry. She is not diaphoretic.   Psychiatric: She has a normal mood and affect. Her behavior is normal.   Labs Reviewed: 11/04/12 wbc 9.4, hb 10.1, plt 272, na 140, k 4.8, bun 35, cr 1.6, glu 131, ca 9.1, tsh 0.883 05/07/13 fe 29, tibc 214, ferritin 172 05/06/13 wbc 10.7, hb 10.5, hct 34, mcv 73.8, plt 330, na 134, k 4.6, cl 100, co2 29, bun 39, cr 1.9, glu 198, ca 8.6, lft wnl, tsh 0.9390, a1c 8  Assessment/Plan:  1. Diabetes mellitus type 2, uncontrolled, with complications A1C was 8 on 05/06/13. Pt refused her lab work this morning. CBG readings consistently above 160 (164-328) and needing Novolog sliding scale coverage. Increase Lantus to 43 units from 38 u SQ injection at bedtime. If CBG >150, give 5 units SQ, if CBG >250, give 8 units SQ, and if CBG >350, give 10 units. Continue to monitor CBG readings. Continue aspirin, ACEI, and statin  2. HTN (hypertension), benign Stable. Continue lisinopril 2.5mg  daily, metoprolol ER 50mg   daily, lasix 20mg  daily, and aldactone 25mg  daily  3. Hemiparesis affecting right side as late effect of cerebrovascular accident Persist. BP is stable. Continue BP monitoring. Continue aspirin, statin, and ACEI  4. Left-sided thoracic back pain Acetaminophen 650mg  PO Q6H PRN mild pain. Reassess and titrate further if needed  Plan of care discuss with nursing staff. Nursing staff verbalized understanding and agree with plan of care.  Labs: HgbA1C

## 2013-09-15 ENCOUNTER — Non-Acute Institutional Stay (SKILLED_NURSING_FACILITY): Payer: PRIVATE HEALTH INSURANCE | Admitting: Internal Medicine

## 2013-09-15 ENCOUNTER — Encounter: Payer: Self-pay | Admitting: Internal Medicine

## 2013-09-15 DIAGNOSIS — N183 Chronic kidney disease, stage 3 unspecified: Secondary | ICD-10-CM

## 2013-09-15 DIAGNOSIS — I798 Other disorders of arteries, arterioles and capillaries in diseases classified elsewhere: Secondary | ICD-10-CM

## 2013-09-15 DIAGNOSIS — E1159 Type 2 diabetes mellitus with other circulatory complications: Secondary | ICD-10-CM

## 2013-09-15 NOTE — Progress Notes (Signed)
Patient ID: Mia Calhoun, female   DOB: 08/19/1940, 73 y.o.   MRN: [id number]  [facility]  Provider:  Oneal GroutMahima Sheral Pfahler, MD  Code Status:  Full  Chief Complaint  Patient presents with  . Medical Management of Chronic Issues   HPI: 73 y/o patient is seen today for routine visit. She has history of CVA and aphasia, DM2, HTN. No recent falls reported. No new skin concerns. No new behavior concerns. No concerns from staff. continues to smoke  ROS Unable to obtain ROS from patient due to expressive aphasia. She expresses having pain in her back.   Medications: Patient's Medications  New Prescriptions   No medications on file  Previous Medications   ACETAMINOPHEN (TYLENOL) 650 MG CR TABLET    Take 650 mg by mouth every 6 (six) hours as needed for pain.   ASPIRIN 81 MG TABLET    Take 81 mg by mouth daily. Take 1 tablet daily to prevent heart attack and stroke.   CHOLECALCIFEROL (VITAMIN D) 1000 UNITS TABLET    Take 2,000 Units by mouth daily.   FUROSEMIDE (LASIX) 20 MG TABLET    Take 20 mg by mouth daily. Take 1 tablet daily for edema.   INSULIN ASPART PROTAMINE- ASPART (NOVOLOG MIX 70/30) (70-30) 100 UNIT/ML INJECTION    Inject 8 Units into the skin 3 (three) times daily with meals. Inject 8 units sub Q before breakfast, lunch and dinner if CBG>180. If CBG >250 give 11 units sub Q. If CBG>350 give 15 units SQ   INSULIN GLARGINE (LANTUS) 100 UNIT/ML INJECTION    Inject 43 Units into the skin at bedtime.    IRON POLYSACCHARIDES (NIFEREX) 150 MG CAPSULE    Take 150 mg by mouth daily. Take 1 tablet daily for anemia.   LEVOTHYROXINE (SYNTHROID, LEVOTHROID) 50 MCG TABLET    Take 50 mcg by mouth daily before breakfast. Take 1 tablet daily for thyroid therapy.   LISINOPRIL (PRINIVIL,ZESTRIL) 2.5 MG TABLET    Take 2.5 mg by mouth daily. Take 1 tablet by mouth for HTN /renal protection.   METOPROLOL SUCCINATE (TOPROL-XL) 50 MG 24 HR TABLET    Take 50 mg by mouth  daily. Take 1 tablet by mouth daily for HTN,  with or immediately following a meal.   MULTIPLE VITAMINS-MINERALS (CERTAGEN PO)    Take 1 tablet by mouth daily.   OMEPRAZOLE (PRILOSEC) 20 MG CAPSULE    Take 20 mg by mouth daily. Take 1 capsule by mouth daily for GERD/GI protection.   SENNOSIDES-DOCUSATE SODIUM (SENOKOT-S) 8.6-50 MG TABLET    Take 1 tablet by mouth 2 (two) times daily. Take 1 tablet twice daily for constipation.   SIMVASTATIN (ZOCOR) 5 MG TABLET    Take 5 mg by mouth at bedtime. Take 1 tablet by mouth every night at bedtime for hyperlipidemia.   SPIRONOLACTONE (ALDACTONE) 25 MG TABLET    Take 25 mg by mouth daily.  Modified Medications   No medications on file  Discontinued Medications   No medications on file    Physical Exam: Filed Vitals:   09/15/13 1700  BP: 131/86  Pulse: 81  Temp: 98.5 F (36.9 C)  Resp: 20  Height: 5\' 7"  (1.702 m)  Weight: 198 lb (89.812 kg)  SpO2: 97%   Constitutional: She appears well-developed and well-nourished. No distress. Expressive aphasia   Mouth/Throat: Oropharynx is clear and moist.   Eyes: EOM are normal. Pupils are equal, round, and reactive to  light.   Neck: Normal range of motion. Neck supple. No JVD present.   Cardiovascular: Normal rate, regular rhythm and normal heart sounds. Weak dp pulses   Pulmonary/Chest: Effort normal and breath sounds normal. No respiratory distress. She exhibits no tenderness.  Abdominal: Bowel sounds are normal. She exhibits no distension and no mass.  Musculoskeletal: She exhibits no edema. Right sided hemiparesis, able to propel in wheelchair by left side  Lymphadenopathy: She has no cervical adenopathy.  Neurological: She is alert.   Skin: Skin is warm and dry. She is not diaphoretic.   Psychiatric: She has a normal mood and affect. Her behavior is normal.   Labs- 11/04/12 wbc 9.4, hb 10.1, plt 272, na 140, k 4.8, bun 35, cr 1.6, glu 131, ca 9.1, tsh 0.883 05/07/13 fe 29, tibc 214, ferritin  172 05/06/13 wbc 10.7, hb 10.5, hct 34, mcv 73.8, plt 330, na 134, k 4.6, cl 100, co2 29, bun 39, cr 1.9, glu 198, ca 8.6, lft wnl, tsh 0.9390, a1c 8  Assessment/plan  Dm with renal involvement Monitor cbg, continue current hypoglycemic regimen. Continue asa and ACEI, lantus 43 u and zocor  Peripheral vascular disease Stable. Continue aspirin. Does not want to quit smoking  ckd stage 3 With vascular problems, continue lisinopril and lasix, monitor bmp

## 2013-09-30 NOTE — Progress Notes (Signed)
Patient ID: Mia Calhoun, female   DOB: 09/07/1940, 73 y.o.   MRN: [id number]    [facility]  Chief Complaint  Patient presents with  . Medical Management of Chronic Issues   No Known Allergies  HPI: 73 y/o patient seen for routine visit. No concerns from staff. continues to smoke  ROS Unable to obtain ROS from patient due to expressive aphasia.   Physical exam Vss, afebrile  Constitutional: She appears well-developed and well-nourished. No distress. Expressive aphasia   Cardiovascular: Normal rate, regular rhythm and normal heart sounds. Weak dp pulses   Pulmonary/Chest: Effort normal and breath sounds normal. No respiratory distress. She exhibits no tenderness.  Abdominal: Bowel sounds are normal. She exhibits no distension and no mass.  Musculoskeletal: She exhibits no edema. Right sided hemiparesis, able to propel in wheelchair by left side   Labs- 11/04/12 wbc 9.4, hb 10.1, plt 272, na 140, k 4.8, bun 35, cr 1.6, glu 131, ca 9.1, tsh 0.883  Assessment/plan  Peripheral vascular disease Stable. Continue aspirin  ckd stage 3 With vascular problems, continue lisinopril and lasix, monitor bmp  Hypothyroidism Continue levothyroxine and monitor tsh

## 2013-10-23 ENCOUNTER — Non-Acute Institutional Stay (SKILLED_NURSING_FACILITY): Payer: PRIVATE HEALTH INSURANCE | Admitting: Internal Medicine

## 2013-10-23 ENCOUNTER — Encounter: Payer: Self-pay | Admitting: Internal Medicine

## 2013-10-23 DIAGNOSIS — I10 Essential (primary) hypertension: Secondary | ICD-10-CM

## 2013-10-23 DIAGNOSIS — E042 Nontoxic multinodular goiter: Secondary | ICD-10-CM

## 2013-10-23 DIAGNOSIS — I69959 Hemiplegia and hemiparesis following unspecified cerebrovascular disease affecting unspecified side: Secondary | ICD-10-CM

## 2013-10-23 DIAGNOSIS — IMO0002 Reserved for concepts with insufficient information to code with codable children: Secondary | ICD-10-CM

## 2013-10-23 DIAGNOSIS — F17200 Nicotine dependence, unspecified, uncomplicated: Secondary | ICD-10-CM | POA: Insufficient documentation

## 2013-10-23 DIAGNOSIS — I69359 Hemiplegia and hemiparesis following cerebral infarction affecting unspecified side: Secondary | ICD-10-CM

## 2013-10-23 DIAGNOSIS — M810 Age-related osteoporosis without current pathological fracture: Secondary | ICD-10-CM

## 2013-10-23 DIAGNOSIS — E1129 Type 2 diabetes mellitus with other diabetic kidney complication: Secondary | ICD-10-CM

## 2013-10-23 DIAGNOSIS — E1165 Type 2 diabetes mellitus with hyperglycemia: Secondary | ICD-10-CM

## 2013-10-23 NOTE — Progress Notes (Signed)
Patient ID: Mia Calhoun, female   DOB: [birth year], 73 y.o.   MRN: [id number]    Facility: [facility]  No Known Allergies  Code status full code  HPI 73 y/o patient is long term care resident seen today for routine follow up. She has CVA with expressive aphasia, DM, goitre, osteoporosis. She is at here baseline. No concerns from patient and staff.  continues to smoke 1-2 cigarettes a day. No falls reported. No new skin concerns. No new behavior concerns.has hemiplegia but can self propel on wheelchair. Can make her needs known. Needs assistance with bathing, toileting, transfers and eating.   ROS Unable to obtain ROS from patient due to expressive aphasia  Past Medical History  Diagnosis Date  . Unspecified vitamin D deficiency   . 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 Congestive heart failure, unspecified   . Unspecified constipation   . Unspecified hypothyroidism   . 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 Type I (juvenile type) diabetes mellitus with neurological manifestations, uncontrolled   . Edema   . Hemiplegia, unspecified, affecting unspecified side   . Osteoporosis, unspecified   . Unspecified disorder of kidney and ureter   . Unspecified hereditary and idiopathic peripheral neuropathy   . Depressive disorder, not elsewhere classified   . Unspecified essential hypertension   . Unspecified late effects of cerebrovascular disease   . Anemia, unspecified   . Nontoxic multinodular goiter   . CKD (chronic kidney disease) stage 3, GFR 30-59 ml/min    Current Outpatient Prescriptions on File Prior to Visit  Medication Sig Dispense Refill  . acetaminophen (TYLENOL) 650 MG CR tablet Take 650 mg by mouth every 6 (six) hours as needed for pain.      Marland Kitchen aspirin 81 MG tablet Take 81 mg by mouth daily. Take 1 tablet daily to prevent heart attack and stroke.      . cholecalciferol (VITAMIN D) 1000  UNITS tablet Take 2,000 Units by mouth daily.      . furosemide (LASIX) 20 MG tablet Take 20 mg by mouth daily. Take 1 tablet daily for edema.      . insulin aspart protamine- aspart (NOVOLOG MIX 70/30) (70-30) 100 UNIT/ML injection Inject 8 Units into the skin 3 (three) times daily with meals. Inject 8 units sub Q before breakfast, lunch and dinner if CBG>180. If CBG >250 give 11 units sub Q. If CBG>350 give 15 units SQ      . insulin glargine (LANTUS) 100 UNIT/ML injection Inject 43 Units into the skin at bedtime.       . iron polysaccharides (NIFEREX) 150 MG capsule Take 150 mg by mouth daily. Take 1 tablet daily for anemia.      Marland Kitchen levothyroxine (SYNTHROID, LEVOTHROID) 50 MCG tablet Take 50 mcg by mouth daily before breakfast. Take 1 tablet daily for thyroid therapy.      Marland Kitchen lisinopril (PRINIVIL,ZESTRIL) 2.5 MG tablet Take 2.5 mg by mouth daily. Take 1 tablet by mouth for HTN /renal protection.      . metoprolol succinate (TOPROL-XL) 50 MG 24 hr tablet Take 50 mg by mouth daily. Take 1 tablet by mouth daily for HTN,  with or immediately following a meal.      . Multiple Vitamins-Minerals (CERTAGEN PO) Take 1 tablet by mouth daily.      Marland Kitchen omeprazole (PRILOSEC) 20 MG capsule Take 20 mg by mouth daily. Take 1 capsule by mouth daily for GERD/GI  protection.      . sennosides-docusate sodium (SENOKOT-S) 8.6-50 MG tablet Take 1 tablet by mouth 2 (two) times daily. Take 1 tablet twice daily for constipation.      . simvastatin (ZOCOR) 5 MG tablet Take 5 mg by mouth at bedtime. Take 1 tablet by mouth every night at bedtime for hyperlipidemia.      Marland Kitchen spironolactone (ALDACTONE) 25 MG tablet Take 25 mg by mouth daily.       No current facility-administered medications on file prior to visit.   Physical exam BP 107/63  Pulse 75  Temp(Src) 97 F (36.1 C)  Resp 17  SpO2 97%  Constitutional: She appears well-developed and well-nourished. No distress. Expressive aphasia   HENT:   Head: Normocephalic and  atraumatic.   Nose: Nose normal.   Mouth/Throat: Oropharynx is clear and moist.   Eyes: EOM are normal. Pupils are equal, round, and reactive to light.   Neck: Normal range of motion. Neck supple. No JVD present.   Cardiovascular: Normal rate, regular rhythm and normal heart sounds. Weak dp pulses   Pulmonary/Chest: Effort normal and breath sounds normal. No respiratory distress. She exhibits no tenderness.   Abdominal: Bowel sounds are normal. She exhibits no distension and no mass.  Musculoskeletal: She exhibits no edema. Right sided hemiparesis, able to propel in wheelchair by left side  Lymphadenopathy: She has no cervical adenopathy.  Neurological: She is alert.   Skin: Skin is warm and dry. She is not diaphoretic.   Psychiatric: She has a normal mood and affect. Her behavior is normal.   Labs- 11/04/12 wbc 9.4, hb 10.1, plt 272, na 140, k 4.8, bun 35, cr 1.6, glu 131, ca 9.1, tsh 0.883 05/07/13 fe 29, tibc 214, ferritin 172 05/06/13 wbc 10.7, hb 10.5, hct 34, mcv 73.8, plt 330, na 134, k 4.6, cl 100, co2 29, bun 39, cr 1.9, glu 198, ca 8.6, lft wnl, tsh 0.9390, a1c 8 09/24/13 wbc 10.4, hb 10.7, hct 36.8, plt 340, a1c 7.9, na 139, k 4.6, glu 57, bun 42, cr 1.8, lft wnl  Assessment/plan  Hypothyroidism with goitre No recent tsh, will check tsh in next lab draw. Continue levothyroxine 50 mcg daily for now  Dm with renal involvement a1c reviewed. Monitor cbg, continue lantus 38 u qhs and novolog 8-15 u premeal. Continue aspirin, statin, ACEI. Uptodate with eye exam (Dr Luciana Axe- 04/07/13) and foot exam 09/25/13. Check urine microalbumin  Hypertensive renal disease bp controlled. Continue zestril 2.5 mg daily with toprol 50 mg daily with lasix   Tobacco use Continues to smoke  cva with hemiplegia bp controlled. Continue bp meds, statin and aspirin. Fall precations and skin care  Senile osteoporosis Continue ca-vit d supplement

## 2013-11-13 ENCOUNTER — Other Ambulatory Visit: Payer: Self-pay

## 2013-11-13 ENCOUNTER — Non-Acute Institutional Stay (SKILLED_NURSING_FACILITY): Payer: PRIVATE HEALTH INSURANCE | Admitting: Internal Medicine

## 2013-11-13 DIAGNOSIS — N952 Postmenopausal atrophic vaginitis: Secondary | ICD-10-CM

## 2013-11-13 DIAGNOSIS — R300 Dysuria: Secondary | ICD-10-CM

## 2013-11-13 DIAGNOSIS — E1129 Type 2 diabetes mellitus with other diabetic kidney complication: Secondary | ICD-10-CM

## 2013-11-13 LAB — URINALYSIS, COMPLETE
BLOOD: NEGATIVE
Bilirubin,UR: NEGATIVE
Glucose,UR: NEGATIVE mg/dL (ref 0–75)
Ketone: NEGATIVE
Nitrite: NEGATIVE
PH: 6 (ref 4.5–8.0)
PROTEIN: NEGATIVE
RBC, UR: NONE SEEN /HPF (ref 0–5)
Specific Gravity: 1.006 (ref 1.003–1.030)
Squamous Epithelial: 5

## 2013-11-15 LAB — URINE CULTURE

## 2013-11-21 NOTE — Progress Notes (Signed)
Patient ID: Mia Calhoun, female   DOB: August 09, 1940, 73 y.o.   MRN: [id number]    Facility: [facility]  No Known Allergies  Code status full code  HPI 73 y/o patient with CVA with expressive aphasia, DM, goitre, osteoporosis, current smoker is seen today for RV. She has expressive aphasia and is unable to provide much history. Staff have noticed her to be scratching her genitalia this morning and notified to nursing. When asked, pt mentions itching and burning in her vaginal area for 2 days. She is able to give answers in yes and no format. She has been having lower abdominal discomfort for 2 days. Staff have not noticed any discharge but redness while cleaning her. No fever reported by staff Appetite is good No new behavioral changes No skin concerns No fall reported  ROS has hemiplegia but can self propel on wheelchair. Can make her needs known. Needs assistance with bathing, toileting, transfers and eating.   Past Medical History  Diagnosis Date  . Unspecified vitamin D deficiency   . 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 Congestive heart failure, unspecified   . Unspecified constipation   . Unspecified hypothyroidism   . 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 Type I (juvenile type) diabetes mellitus with neurological manifestations, uncontrolled   . Edema   . Hemiplegia, unspecified, affecting unspecified side   . Osteoporosis, unspecified   . Unspecified disorder of kidney and ureter   . Unspecified hereditary and idiopathic peripheral neuropathy   . Depressive disorder, not elsewhere classified   . Unspecified essential hypertension   . Unspecified late effects of cerebrovascular disease   . Anemia, unspecified   . Nontoxic multinodular goiter   . CKD (chronic kidney disease) stage 3, GFR 30-59 ml/min    Medication reviewed. See [facility]  Physical exam BP 143/73  Pulse 79  Temp(Src) 98  F (36.7 C)  Resp 18  Constitutional: She appears well-developed and well-nourished. No distress. Expressive aphasia   HENT:   Head: Normocephalic and atraumatic.   Nose: Nose normal.   Mouth/Throat: Oropharynx is clear and moist.   Eyes: EOM are normal. Pupils are equal, round, and reactive to light.   Neck: Normal range of motion. Neck supple. No JVD present.   Cardiovascular: Normal rate, regular rhythm and normal heart sounds. Weak dp pulses   Pulmonary/Chest: Effort normal and breath sounds normal. No respiratory distress. She exhibits no tenderness.   Abdominal: Bowel sounds are normal. She exhibits no distension and no mass. Winces on suprapubic area palpation Genitalia: vaginal atrophy noted with erythema in vulvar area, moisture in between thighs, foul odor, no vaginal discharge Musculoskeletal: She exhibits no edema. Right sided hemiparesis, able to propel in wheelchair by left side  Lymphadenopathy: She has no cervical adenopathy.  Neurological: She is alert.   Skin: Skin is warm and dry. She is not diaphoretic.  long finger nails with dirt and debris in it Psychiatric: She has a normal mood and affect. Her behavior is normal.   Labs- 11/04/12 wbc 9.4, hb 10.1, plt 272, na 140, k 4.8, bun 35, cr 1.6, glu 131, ca 9.1, tsh 0.883 05/07/13 fe 29, tibc 214, ferritin 172 05/06/13 wbc 10.7, hb 10.5, hct 34, mcv 73.8, plt 330, na 134, k 4.6, cl 100, co2 29, bun 39, cr 1.9, glu 198, ca 8.6, lft wnl, tsh 0.9390, a1c 8 09/24/13 wbc 10.4, hb 10.7, hct 36.8, plt 340, a1c 7.9,  na 139, k 4.6, glu 57, bun 42, cr 1.8, lft wnl  Assessment/plan  Dysuria Possible uti but atrophic vaginitis with raw erythematous area can also cause her to have bruning sensation. Send u/a with c/s and treat her empirically with bactrim ds 1 tab bid for 5 days. Treat empirically with a dose of fluconazole for vaginitis with hx of DM. Keep area dry. Trim her fingernails. Encouraged hdyration. Monitor cbg  Atrophic  vaginitis Start estrace vaginal cream 0.5 g/day once a day for 2 weeks and reassess  Dm with renal involvement a1c reviewed from 08/08/13 7.9. Monitor cbg, continue lantus 38 u qhs and novolog 8-15 u premeal. Continue aspirin, statin, ACEI. Uptodate with eye exam (Dr Luciana Axe- 04/07/13) and foot exam 09/25/13.

## 2013-12-11 ENCOUNTER — Encounter: Payer: Self-pay | Admitting: Internal Medicine

## 2013-12-11 ENCOUNTER — Non-Acute Institutional Stay (SKILLED_NURSING_FACILITY): Payer: PRIVATE HEALTH INSURANCE | Admitting: Internal Medicine

## 2013-12-11 DIAGNOSIS — K219 Gastro-esophageal reflux disease without esophagitis: Secondary | ICD-10-CM

## 2013-12-11 DIAGNOSIS — IMO0002 Reserved for concepts with insufficient information to code with codable children: Secondary | ICD-10-CM

## 2013-12-11 DIAGNOSIS — I10 Essential (primary) hypertension: Secondary | ICD-10-CM

## 2013-12-11 DIAGNOSIS — N183 Chronic kidney disease, stage 3 unspecified: Secondary | ICD-10-CM

## 2013-12-11 DIAGNOSIS — Z720 Tobacco use: Secondary | ICD-10-CM

## 2013-12-11 DIAGNOSIS — D508 Other iron deficiency anemias: Secondary | ICD-10-CM

## 2013-12-11 DIAGNOSIS — G8190 Hemiplegia, unspecified affecting unspecified side: Secondary | ICD-10-CM

## 2013-12-11 DIAGNOSIS — M810 Age-related osteoporosis without current pathological fracture: Secondary | ICD-10-CM

## 2013-12-11 DIAGNOSIS — I69359 Hemiplegia and hemiparesis following cerebral infarction affecting unspecified side: Secondary | ICD-10-CM

## 2013-12-11 DIAGNOSIS — E785 Hyperlipidemia, unspecified: Secondary | ICD-10-CM

## 2013-12-11 DIAGNOSIS — E038 Other specified hypothyroidism: Secondary | ICD-10-CM

## 2013-12-11 DIAGNOSIS — F17200 Nicotine dependence, unspecified, uncomplicated: Secondary | ICD-10-CM

## 2013-12-11 DIAGNOSIS — I5032 Chronic diastolic (congestive) heart failure: Secondary | ICD-10-CM

## 2013-12-11 DIAGNOSIS — E1129 Type 2 diabetes mellitus with other diabetic kidney complication: Secondary | ICD-10-CM

## 2013-12-11 DIAGNOSIS — E1165 Type 2 diabetes mellitus with hyperglycemia: Secondary | ICD-10-CM

## 2013-12-11 NOTE — Progress Notes (Signed)
Patient ID: Mia Calhoun, female   DOB: 03/21/1940, 73 y.o.   MRN: [id number]    [facility]  No Known Allergies  Code status full code  HPI 73 y/o patient is long term care resident seen today for annual exam. She has CVA with expressive aphasia, DM, goitre, osteoporosis. She is at here baseline. No concerns from patient and staff.  continues to smoke 1-2 cigarettes a day. No falls reported. No new skin concerns. No new behavior concerns.has hemiplegia but can self propel on wheelchair. Can make her needs known. Needs assistance with bathing, toileting, transfers and eating. No urinary compalints  ROS Unable to obtain ROS from patient due to expressive aphasia  Past Medical History  Diagnosis Date  . Unspecified vitamin D deficiency   . 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 Congestive heart failure, unspecified   . Unspecified constipation   . Unspecified hypothyroidism   . 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 Type I (juvenile type) diabetes mellitus with neurological manifestations, uncontrolled   . Edema   . Hemiplegia, unspecified, affecting unspecified side   . Osteoporosis, unspecified   . Unspecified disorder of kidney and ureter   . Unspecified hereditary and idiopathic peripheral neuropathy   . Depressive disorder, not elsewhere classified   . Unspecified essential hypertension   . Unspecified late effects of cerebrovascular disease   . Anemia, unspecified   . Nontoxic multinodular goiter   . CKD (chronic kidney disease) stage 3, GFR 30-59 ml/min    Current Outpatient Prescriptions on File Prior to Visit  Medication Sig Dispense Refill  . acetaminophen (TYLENOL) 650 MG CR tablet Take 650 mg by mouth every 6 (six) hours as needed for pain.      Marland Kitchen. aspirin 81 MG tablet Take 81 mg by mouth daily. Take 1 tablet daily to prevent heart attack and stroke.      . cholecalciferol  (VITAMIN D) 1000 UNITS tablet Take 2,000 Units by mouth daily.      . furosemide (LASIX) 20 MG tablet Take 20 mg by mouth daily. Take 1 tablet daily for edema.      . insulin aspart protamine- aspart (NOVOLOG MIX 70/30) (70-30) 100 UNIT/ML injection Inject 8 Units into the skin 3 (three) times daily with meals. Inject 8 units sub Q before breakfast, lunch and dinner if CBG>180. If CBG >250 give 11 units sub Q. If CBG>350 give 15 units SQ      . insulin glargine (LANTUS) 100 UNIT/ML injection Inject 38 Units into the skin at bedtime.       . iron polysaccharides (NIFEREX) 150 MG capsule Take 150 mg by mouth daily. Take 1 tablet daily for anemia.      Marland Kitchen. levothyroxine (SYNTHROID, LEVOTHROID) 50 MCG tablet Take 50 mcg by mouth daily before breakfast. Take 1 tablet daily for thyroid therapy.      Marland Kitchen. lisinopril (PRINIVIL,ZESTRIL) 2.5 MG tablet Take 2.5 mg by mouth daily. Take 1 tablet by mouth for HTN /renal protection.      . metoprolol succinate (TOPROL-XL) 50 MG 24 hr tablet Take 50 mg by mouth daily. Take 1 tablet by mouth daily for HTN,  with or immediately following a meal.      . Multiple Vitamins-Minerals (CERTAGEN PO) Take 1 tablet by mouth daily.      Marland Kitchen. omeprazole (PRILOSEC) 20 MG capsule Take 20 mg by mouth daily. Take 1 capsule by mouth daily for  GERD/GI protection.      . sennosides-docusate sodium (SENOKOT-S) 8.6-50 MG tablet Take 1 tablet by mouth 2 (two) times daily. Take 1 tablet twice daily for constipation.      . simvastatin (ZOCOR) 5 MG tablet Take 5 mg by mouth at bedtime. Take 1 tablet by mouth every night at bedtime for hyperlipidemia.      Marland Kitchen. spironolactone (ALDACTONE) 25 MG tablet Take 25 mg by mouth daily.       No current facility-administered medications on file prior to visit.   Past Surgical History  Procedure Laterality Date  . Eye surgery Left 03/22/2009    h/o diabetic retinopathy    Physical exam BP 122/64  Pulse 72  Temp(Src) 98.8 F (37.1 C)  Resp 18  Ht 5\' 7"   (1.702 m)  Wt 191 lb (86.637 kg)  BMI 29.91 kg/m2  SpO2 99%  Constitutional: She appears well-developed and well-nourished. No distress. Expressive aphasia   HENT:   Head: Normocephalic and atraumatic.   Nose: Nose normal.   Mouth/Throat: Oropharynx is clear and moist.   Eyes: EOM are normal. Pupils are equal, round, and reactive to light.   Neck: Normal range of motion. Neck supple. No JVD present.   Cardiovascular: Normal rate, regular rhythm and normal heart sounds. Weak dp pulses   Pulmonary/Chest: Effort normal and breath sounds normal. No respiratory distress. She exhibits no tenderness.   Abdominal: Bowel sounds are normal. She exhibits no distension and no mass.  Musculoskeletal: She exhibits no edema. Right sided hemiparesis, able to propel in wheelchair by left side  Lymphadenopathy: She has no cervical adenopathy.  Neurological: She is alert.   Skin: Skin is warm and dry. She is not diaphoretic.   Psychiatric: She has a normal mood and affect. Her behavior is normal.   Labs- 11/04/12 wbc 9.4, hb 10.1, plt 272, na 140, k 4.8, bun 35, cr 1.6, glu 131, ca 9.1, tsh 0.883 05/07/13 fe 29, tibc 214, ferritin 172 05/06/13 wbc 10.7, hb 10.5, hct 34, mcv 73.8, plt 330, na 134, k 4.6, cl 100, co2 29, bun 39, cr 1.9, glu 198, ca 8.6, lft wnl, tsh 0.9390, a1c 8 09/24/13 wbc 10.4, hb 10.7, hct 36.8, plt 340, a1c 7.9, na 139, k 4.6, glu 57, bun 42, cr 1.8, lft wnl  Assessment/plan  Hypothyroidism with goitre Continue levothyroxine 50 mcg daily for now and check ths  cva with hemiplegia bp controlled. Continue bp meds, statin and aspirin. Fall precations and skin care  chf Continue lasix 20 mg daily and aldactone 25 mg daily, monitor weight, continue toprol xl 50 mg daily and lisinopril 2.5 mg daily Wt Readings from Last 3 Encounters:  12/11/13 191 lb (86.637 kg)  09/15/13 198 lb (89.812 kg)  08/18/13 198 lb 12.8 oz (90.175 kg)    Dm with renal involvement a1c reviewed. Monitor cbg,  continue lantus 38 u qhs and novolog 8-15 u premeal. Continue aspirin, statin, ACEI. Uptodate with eye exam (Dr Luciana Axeankin- 04/07/13) and foot exam 09/25/13. Check a1c and urine microalbumin, pt has refused blood work in past  Hypertensive renal disease bp controlled. Continue zestril 2.5 mg daily with toprol 50 mg daily with lasix   Tobacco use Continues to smoke  Senile osteoporosis Continue ca-vit d supplement  gerd Continue prilosec 10 mg daily  Constipation Continue senna s  Iron def anemia Continue iron supplement, on stool softener  Hyperlipidemia Continue simvastatin 5 mg daily

## 2014-01-22 ENCOUNTER — Non-Acute Institutional Stay (SKILLED_NURSING_FACILITY): Payer: PRIVATE HEALTH INSURANCE | Admitting: Internal Medicine

## 2014-01-22 DIAGNOSIS — H269 Unspecified cataract: Secondary | ICD-10-CM

## 2014-01-22 DIAGNOSIS — N189 Chronic kidney disease, unspecified: Secondary | ICD-10-CM

## 2014-01-22 DIAGNOSIS — Z720 Tobacco use: Secondary | ICD-10-CM

## 2014-01-22 DIAGNOSIS — G4700 Insomnia, unspecified: Secondary | ICD-10-CM

## 2014-01-22 DIAGNOSIS — F17200 Nicotine dependence, unspecified, uncomplicated: Secondary | ICD-10-CM

## 2014-01-22 DIAGNOSIS — K219 Gastro-esophageal reflux disease without esophagitis: Secondary | ICD-10-CM

## 2014-01-22 DIAGNOSIS — E1122 Type 2 diabetes mellitus with diabetic chronic kidney disease: Secondary | ICD-10-CM

## 2014-01-22 NOTE — Progress Notes (Signed)
Patient ID: Mia Calhoun, female   DOB: 01/01/1941, 73 y.o.   MRN: [id number]    Facility: [facility]  No Known Allergies  Code status full code  CC- medical management of chronic issues  HPI 73 y/o patient is seen today for routine follow up. She is at here baseline. She is having trouble falling asleep at night. She had her cataract surgery on 12/25/13. No other concerns from patient and staff. continues to smoke 1-2 cigarettes a day. No falls reported. No new skin concerns. She has hemiplegia but can self propel on wheelchair. Can make her needs known. Needs assistance with bathing, toileting, transfers and eating.   ROS Unable to obtain ROS from patient due to expressive aphasia    Past Medical History  Diagnosis Date  . Unspecified vitamin D deficiency   . 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 Congestive heart failure, unspecified   . Unspecified constipation   . Unspecified hypothyroidism   . 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 Type I (juvenile type) diabetes mellitus with neurological manifestations, uncontrolled   . Edema   . Hemiplegia, unspecified, affecting unspecified side   . Osteoporosis, unspecified   . Unspecified disorder of kidney and ureter   . Unspecified hereditary and idiopathic peripheral neuropathy   . Depressive disorder, not elsewhere classified   . Unspecified essential hypertension   . Unspecified late effects of cerebrovascular disease   . Anemia, unspecified   . Nontoxic multinodular goiter   . CKD (chronic kidney disease) stage 3, GFR 30-59 ml/min    Medication reviewed. See [facility]  Physical exam BP 129/76 mmHg  Pulse 72  Temp(Src) 98.2 F (36.8 C)  Resp 18  Wt 192 lb 3.2 oz (87.181 kg)  SpO2 97%  Constitutional: She appears well-developed and well-nourished. No distress. Expressive aphasia   HENT:   Head: Normocephalic and atraumatic.   Nose: Nose  normal.   Mouth/Throat: Oropharynx is clear and moist.   Eyes: EOM are normal. Pupils are equal, round, and reactive to light.   Neck: Normal range of motion. Neck supple. No JVD present.   Cardiovascular: Normal rate, regular rhythm and normal heart sounds. Weak dp pulses   Pulmonary/Chest: Effort normal and breath sounds normal. No respiratory distress. She exhibits no tenderness.   Abdominal: Bowel sounds are normal. She exhibits no distension and no mass.  Musculoskeletal: She exhibits no edema. Right sided hemiparesis, able to propel in wheelchair by left side  Lymphadenopathy: She has no cervical adenopathy.  Neurological: She is alert.   Skin: Skin is warm and dry. She is not diaphoretic.   Psychiatric: She has a normal mood and affect. Her behavior is normal.   Labs- 11/04/12 wbc 9.4, hb 10.1, plt 272, na 140, k 4.8, bun 35, cr 1.6, glu 131, ca 9.1, tsh 0.883 05/07/13 fe 29, tibc 214, ferritin 172 05/06/13 wbc 10.7, hb 10.5, hct 34, mcv 73.8, plt 330, na 134, k 4.6, cl 100, co2 29, bun 39, cr 1.9, glu 198, ca 8.6, lft wnl, tsh 0.9390, a1c 8 09/24/13 wbc 10.4, hb 10.7, hct 36.8, plt 340, a1c 7.9, na 139, k 4.6, glu 57, bun 42, cr 1.8, lft wnl 12/12/13 a1c 7.4  Assessment/plan  Dm with renal involvement a1c reviewed. Monitor cbg, continue lantus 38 u qhs and novolog 8-15 u premeal. Continue aspirin, statin, ACEI. Uptodate with eye exam and foot exam  Cataract S/p cataract surgery of both eyes.  F/u with Dr Dione BoozeGroat prn  Insomnia Add melatonin 3 mg qhs for now and reassess  Tobacco use Continues to smoke  gerd Stable on current regimen of ppi. No dose changes made

## 2014-02-12 ENCOUNTER — Non-Acute Institutional Stay (SKILLED_NURSING_FACILITY): Payer: PRIVATE HEALTH INSURANCE | Admitting: Internal Medicine

## 2014-02-12 DIAGNOSIS — E1122 Type 2 diabetes mellitus with diabetic chronic kidney disease: Secondary | ICD-10-CM

## 2014-02-12 DIAGNOSIS — G4700 Insomnia, unspecified: Secondary | ICD-10-CM

## 2014-02-12 DIAGNOSIS — E039 Hypothyroidism, unspecified: Secondary | ICD-10-CM

## 2014-02-12 DIAGNOSIS — H259 Unspecified age-related cataract: Secondary | ICD-10-CM

## 2014-02-12 DIAGNOSIS — N189 Chronic kidney disease, unspecified: Secondary | ICD-10-CM

## 2014-02-12 NOTE — Progress Notes (Signed)
Patient ID: Mia Calhoun, female   DOB: 01/14/1941, 73 y.o.   MRN: [id number]     Facility[facility]  No Known Allergies  Code status full code  Cc- routine visit  HPI 73 y/o patient is long term care resident seen today for routine follow up. She has CVA with expressive aphasia, DM, goitre, osteoporosis. She is at here baseline. No concerns from patient and staff.  continues to smoke 1-2 cigarettes a day. No falls reported. No new skin concerns. Needs assistance with bathing, toileting, transfers and eating. Sleeping well with melatonin. Tolerated cataract removal surgery well last month  ROS Unable to obtain ROS from patient due to expressive aphasia    Past Medical History  Diagnosis Date  . Unspecified vitamin D deficiency   . 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 Congestive heart failure, unspecified   . Unspecified constipation   . Unspecified hypothyroidism   . 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 Type I (juvenile type) diabetes mellitus with neurological manifestations, uncontrolled   . Edema   . Hemiplegia, unspecified, affecting unspecified side   . Osteoporosis, unspecified   . Unspecified disorder of kidney and ureter   . Unspecified hereditary and idiopathic peripheral neuropathy   . Depressive disorder, not elsewhere classified   . Unspecified essential hypertension   . Unspecified late effects of cerebrovascular disease   . Anemia, unspecified   . Nontoxic multinodular goiter   . CKD (chronic kidney disease) stage 3, GFR 30-59 ml/min    [facility]  Physical exam BP 126/60 mmHg  Pulse 76  Temp(Src) 97.5 F (36.4 C)  Resp 18  Constitutional: She appears well-developed and well-nourished. No distress. Expressive aphasia   HENT:   Head: Normocephalic and atraumatic.   Nose: Nose normal.   Mouth/Throat: Oropharynx is clear and moist.   Eyes: EOM  are normal. Pupils are equal, round, and reactive to light.   Neck: Normal range of motion. Neck supple. No JVD present.   Cardiovascular: Normal rate, regular rhythm and normal heart sounds. Weak dp pulses   Pulmonary/Chest: Effort normal and breath sounds normal. No respiratory distress. She exhibits no tenderness.   Abdominal: Bowel sounds are normal. She exhibits no distension and no mass.  Musculoskeletal: She exhibits no edema. Right sided hemiparesis, able to propel in wheelchair by left side  Lymphadenopathy: She has no cervical adenopathy.  Neurological: She is alert.   Skin: Skin is warm and dry. She is not diaphoretic.   Psychiatric: She has a normal mood and affect. Her behavior is normal.   Labs- 11/04/12 wbc 9.4, hb 10.1, plt 272, na 140, k 4.8, bun 35, cr 1.6, glu 131, ca 9.1, tsh 0.883 05/07/13 fe 29, tibc 214, ferritin 172 05/06/13 wbc 10.7, hb 10.5, hct 34, mcv 73.8, plt 330, na 134, k 4.6, cl 100, co2 29, bun 39, cr 1.9, glu 198, ca 8.6, lft wnl, tsh 0.9390, a1c 8 09/24/13 wbc 10.4, hb 10.7, hct 36.8, plt 340, a1c 7.9, na 139, k 4.6, glu 57, bun 42, cr 1.8, lft wnl 12/02/13 tsh 0.82, a1c 7.4  Assessment/plan  Hypothyroidism with goitre Continue levothyroxine 50 mcg daily for now  Insomnia Continue melatonin and monitor  Cataract post extraction in 10/15 and 11/15 respectively, tolerated well, monitor clinically  Dm with renal involvement a1c reviewed. Monitor cbg, continue lantus 38 u qhs and novolog 8-15 u premeal. Continue aspirin, statin, ACEI. Uptodate with  eye exam (Dr Luciana Axeankin- 04/07/13) and foot exam 09/25/13.

## 2014-02-16 DIAGNOSIS — K219 Gastro-esophageal reflux disease without esophagitis: Secondary | ICD-10-CM | POA: Insufficient documentation

## 2014-02-16 DIAGNOSIS — G4700 Insomnia, unspecified: Secondary | ICD-10-CM | POA: Insufficient documentation

## 2014-02-16 DIAGNOSIS — E1122 Type 2 diabetes mellitus with diabetic chronic kidney disease: Secondary | ICD-10-CM | POA: Insufficient documentation

## 2014-02-16 DIAGNOSIS — H269 Unspecified cataract: Secondary | ICD-10-CM | POA: Insufficient documentation

## 2014-03-26 ENCOUNTER — Non-Acute Institutional Stay (SKILLED_NURSING_FACILITY): Payer: Medicare Other | Admitting: Internal Medicine

## 2014-03-26 DIAGNOSIS — I5032 Chronic diastolic (congestive) heart failure: Secondary | ICD-10-CM

## 2014-03-26 DIAGNOSIS — K219 Gastro-esophageal reflux disease without esophagitis: Secondary | ICD-10-CM

## 2014-03-26 DIAGNOSIS — D509 Iron deficiency anemia, unspecified: Secondary | ICD-10-CM

## 2014-03-26 NOTE — Progress Notes (Signed)
Patient ID: Mia Calhoun, female   DOB: 01-26-1941, 74 y.o.   MRN: [id number]    Facility: [facility]  No Known Allergies  Code status full code  CC- routine visit  HPI 74 y/o patient is long term care resident seen today for routine follow up. She denies any concerns. No concerns from the staff She has CVA with expressive aphasia, DM, goitre, osteoporosis.  No falls reported. No new skin concerns. Needs assistance with bathing, toileting, transfers and eating. Continues to smoke  ROS Unable to obtain ROS from patient due to expressive aphasia  Past Medical History  Diagnosis Date  . Unspecified vitamin D deficiency   . 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 Congestive heart failure, unspecified   . Unspecified constipation   . Unspecified hypothyroidism   . 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 Type I (juvenile type) diabetes mellitus with neurological manifestations, uncontrolled   . Edema   . Hemiplegia, unspecified, affecting unspecified side   . Osteoporosis, unspecified   . Unspecified disorder of kidney and ureter   . Unspecified hereditary and idiopathic peripheral neuropathy   . Depressive disorder, not elsewhere classified   . Unspecified essential hypertension   . Unspecified late effects of cerebrovascular disease   . Anemia, unspecified   . Nontoxic multinodular goiter   . CKD (chronic kidney disease) stage 3, GFR 30-59 ml/min    Medication reviewed. See [facility]  Physical exam BP 123/60 mmHg  Pulse 78  Temp(Src) 97.4 F (36.3 C)  Resp 19  SpO2 96%  Constitutional: She appears well-developed and well-nourished. No distress. Expressive aphasia   HENT:   Head: Normocephalic and atraumatic.   Nose: Nose normal.   Mouth/Throat: Oropharynx is clear and moist.   Eyes: EOM are normal. Pupils are equal, round, and reactive to light.   Neck: Normal range of motion. Neck supple.  No JVD present.   Cardiovascular: Normal rate, regular rhythm and normal heart sounds. Weak dp pulses   Pulmonary/Chest: Effort normal and breath sounds normal. No respiratory distress. She exhibits no tenderness.   Abdominal: Bowel sounds are normal. She exhibits no distension and no mass.  Musculoskeletal: She exhibits no edema. Right sided hemiparesis, able to propel in wheelchair by left side  Lymphadenopathy: She has no cervical adenopathy.  Neurological: She is alert.   Skin: Skin is warm and dry. She is not diaphoretic.   Psychiatric: She has a normal mood and affect. Her behavior is normal.   Labs- 11/04/12 wbc 9.4, hb 10.1, plt 272, na 140, k 4.8, bun 35, cr 1.6, glu 131, ca 9.1, tsh 0.883 05/07/13 fe 29, tibc 214, ferritin 172 05/06/13 wbc 10.7, hb 10.5, hct 34, mcv 73.8, plt 330, na 134, k 4.6, cl 100, co2 29, bun 39, cr 1.9, glu 198, ca 8.6, lft wnl, tsh 0.9390, a1c 8 09/24/13 wbc 10.4, hb 10.7, hct 36.8, plt 340, a1c 7.9, na 139, k 4.6, glu 57, bun 42, cr 1.8, lft wnl 12/02/13 tsh 0.82 12/18/13 a1c 7.4  Assessment/plan  Diastolic chf Euvolemic. On lasix 20 mg daily, aldactone 25 mg daily and toprol xl 50 mg daily and liisnopril 2.5 mg daily, check bmp  gerd Continues to smoke, no complaints of heartburn at present. Limited mobility with stroke. Continuation of PPI increases risk for osteoporosis in this pt who is also at risk for disuse osteoporosis. Also chronic use increases risk for CKD. D/c prilosec for now. If  symptoms recur, consider h2 blocker first  Iron def anemia Continue ferrex 150 mg daily, h&h in July stable. Continue stool softeners

## 2014-04-23 ENCOUNTER — Non-Acute Institutional Stay (SKILLED_NURSING_FACILITY): Payer: Medicare Other | Admitting: Internal Medicine

## 2014-04-23 DIAGNOSIS — G4700 Insomnia, unspecified: Secondary | ICD-10-CM

## 2014-04-23 DIAGNOSIS — J449 Chronic obstructive pulmonary disease, unspecified: Secondary | ICD-10-CM

## 2014-04-23 DIAGNOSIS — F17200 Nicotine dependence, unspecified, uncomplicated: Secondary | ICD-10-CM

## 2014-04-23 DIAGNOSIS — K219 Gastro-esophageal reflux disease without esophagitis: Secondary | ICD-10-CM

## 2014-04-23 DIAGNOSIS — Z720 Tobacco use: Secondary | ICD-10-CM

## 2014-04-23 DIAGNOSIS — E11349 Type 2 diabetes mellitus with severe nonproliferative diabetic retinopathy without macular edema: Secondary | ICD-10-CM

## 2014-04-23 DIAGNOSIS — E113499 Type 2 diabetes mellitus with severe nonproliferative diabetic retinopathy without macular edema, unspecified eye: Secondary | ICD-10-CM

## 2014-05-24 DIAGNOSIS — J449 Chronic obstructive pulmonary disease, unspecified: Secondary | ICD-10-CM | POA: Insufficient documentation

## 2014-05-24 DIAGNOSIS — E113499 Type 2 diabetes mellitus with severe nonproliferative diabetic retinopathy without macular edema, unspecified eye: Secondary | ICD-10-CM | POA: Insufficient documentation

## 2014-05-24 NOTE — Progress Notes (Signed)
Patient ID: Mia Calhoun, female   DOB: 10/15/1940, 74 y.o.   MRN: [id number]    Facility: [facility]  No Known Allergies  Code status full code  CC- routine visit  HPI 74 y/o patient is long term care resident seen today for routine follow up. She denies any concerns. No concerns from the staff  No falls reported. No new skin concerns. Needs assistance with bathing, toileting, transfers and eating. Continues to smoke cbg readings reviewed Weight is stable  ROS Unable to obtain ROS from patient due to expressive aphasia  Past medical history reviewed  [facility]  Physical exam BP 130/71 mmHg  Pulse 78  Temp(Src) 97.6 F (36.4 C)  Resp 18  SpO2 97%  Constitutional: She appears well-developed and well-nourished. No distress. Expressive aphasia   Neck: Normal range of motion. Neck supple. No JVD present.   Cardiovascular: Normal rate, regular rhythm and normal heart sounds. Weak dp pulses   Pulmonary/Chest: Effort normal and breath sounds normal. No respiratory distress. She exhibits no tenderness.   Abdominal: Bowel sounds are normal. She exhibits no distension and no mass.  Musculoskeletal: She exhibits no edema. Right sided hemiparesis, able to propel in wheelchair by left side  Lymphadenopathy: She has no cervical adenopathy.  Neurological: She is alert.   Skin: Skin is warm and dry. She is not diaphoretic.   Psychiatric: She has a normal mood and affect. Her behavior is normal.   Labs- 11/04/12 wbc 9.4, hb 10.1, plt 272, na 140, k 4.8, bun 35, cr 1.6, glu 131, ca 9.1, tsh 0.883 05/07/13 fe 29, tibc 214, ferritin 172 05/06/13 wbc 10.7, hb 10.5, hct 34, mcv 73.8, plt 330, na 134, k 4.6, cl 100, co2 29, bun 39, cr 1.9, glu 198, ca 8.6, lft wnl, tsh 0.9390, a1c 8 09/24/13 wbc 10.4, hb 10.7, hct 36.8, plt 340, a1c 7.9, na 139, k 4.6, glu 57, bun 42, cr 1.8, lft wnl 12/02/13 tsh 0.82 12/12/13 a1c  7.4  Assessment/plan  Dm type 2 with diabetic retinopathy Continue lantus 35 u and novolog with meals. Monitor a1c. uptodate with eye exam. Monitor cbg  Copd Currently not on any bronchodilators, monitor clinically, continues to smoke  Tobacco use Encouraged cessation, pt not willing   Insomnia Stable, continue melatonin  gerd Symptoms controlled. Off medications. Monitor clinically.

## 2014-05-25 ENCOUNTER — Non-Acute Institutional Stay (SKILLED_NURSING_FACILITY): Payer: Medicare Other | Admitting: Internal Medicine

## 2014-05-25 DIAGNOSIS — D509 Iron deficiency anemia, unspecified: Secondary | ICD-10-CM

## 2014-05-25 DIAGNOSIS — K219 Gastro-esophageal reflux disease without esophagitis: Secondary | ICD-10-CM | POA: Diagnosis not present

## 2014-05-25 DIAGNOSIS — E039 Hypothyroidism, unspecified: Secondary | ICD-10-CM | POA: Diagnosis not present

## 2014-05-25 NOTE — Progress Notes (Signed)
Patient ID: Mia Calhoun, female   DOB: 05/28/1940, 74 y.o.   MRN: [id number]    [facility]ility[facility]  No Known Allergies  Code status full code  CC- routine visit  HPI 74 y/o patient is long term care resident seen today for routine follow up. She denies any concerns. No concerns from the staff   ROS Unable to obtain ROS from patient due to expressive aphasia No falls reported.  No new skin concerns.  Needs assistance with bathing, toileting, transfers and eating. Continues to smoke   Past medical history reviewed  Medication [facility]. [facility]  Physical exam BP 128/70 mmHg  Pulse 86  Temp(Src) 99.1 F (37.3 C)  Resp 18  SpO2 99%  Constitutional: She appears well-developed and well-nourished. No distress. Expressive aphasia    Cardiovascular: Normal rate, regular rhythm and normal heart sounds. Weak dp pulses   Pulmonary/Chest: Effort normal and breath sounds normal. No respiratory distress. She exhibits no tenderness.   Abdominal: Bowel sounds are normal. She exhibits no distension and no mass.  Musculoskeletal: She exhibits no edema. Right sided hemiparesis, able to propel in wheelchair by left side  Neurological: She is alert.   Skin: Skin is warm and dry. She is not diaphoretic.     Labs- reviewed   Assessment/plan  gerd Symptoms controlled. Off medications. Monitor clinically.    Iron def anemia Continue ferrex and bowel regimen  Hypothyroidism Continue levothyroxoine, tolerating it well, monitor clinically

## 2014-05-26 LAB — HEMOGLOBIN A1C: HEMOGLOBIN A1C: 7.7

## 2014-05-29 LAB — BASIC METABOLIC PANEL: POTASSIUM: 5.1 mmol/L (ref 3.4–5.3)

## 2014-06-15 ENCOUNTER — Non-Acute Institutional Stay (SKILLED_NURSING_FACILITY): Payer: Medicare Other | Admitting: Internal Medicine

## 2014-06-15 DIAGNOSIS — D72829 Elevated white blood cell count, unspecified: Secondary | ICD-10-CM | POA: Diagnosis not present

## 2014-06-15 DIAGNOSIS — E1159 Type 2 diabetes mellitus with other circulatory complications: Secondary | ICD-10-CM | POA: Diagnosis not present

## 2014-06-15 DIAGNOSIS — E1151 Type 2 diabetes mellitus with diabetic peripheral angiopathy without gangrene: Secondary | ICD-10-CM

## 2014-06-15 DIAGNOSIS — K5901 Slow transit constipation: Secondary | ICD-10-CM

## 2014-06-15 DIAGNOSIS — E785 Hyperlipidemia, unspecified: Secondary | ICD-10-CM | POA: Diagnosis not present

## 2014-06-15 DIAGNOSIS — D509 Iron deficiency anemia, unspecified: Secondary | ICD-10-CM | POA: Diagnosis not present

## 2014-06-15 NOTE — Progress Notes (Signed)
Patient ID: Mia Calhoun, female   DOB: 04/26/1940, 74 y.o.   MRN: [id number]    [facility]  No Known Allergies  Code status full code  CC- routine visit  HPI 74 y/o patient is long term care resident seen today for routine follow up. She was treated for hyperkalemia on 05/28/14 with kayexalate. Her k level improved. She is seen in her room sitting on the wheelchair. She continues to smoke.She denies any concerns. No concerns from the staff . Weight is stable compared to last month but has gained 5 lbs since 11/15  ROS Unable to obtain ROS from patient due to expressive aphasia No falls reported.   No new skin concerns.   Needs assistance with bathing, toileting, transfers and eating.    Past medical history reviewed  Medication [facility]. [facility]  Physical exam BP 118/59 mmHg  Pulse 76  Temp(Src) 97 F (36.1 C)  Resp 18  Wt 197 lb 9.6 oz (89.631 kg)  SpO2 98%  Wt Readings from Last 3 Encounters:  06/15/14 197 lb 9.6 oz (89.631 kg)  01/22/14 192 lb 3.2 oz (87.181 kg)  12/11/13 191 lb (86.637 kg)    Constitutional: She appears well-developed and well-nourished. No distress. Expressive aphasia    Cardiovascular: Normal rate, regular rhythm and normal heart sounds. Weak dp pulses   Pulmonary/Chest: Effort normal and breath sounds normal. No respiratory distress. She exhibits no tenderness.   Abdominal: Bowel sounds are normal. She exhibits no distension and no mass.  Musculoskeletal: She exhibits no edema. Right sided hemiparesis, able to propel in wheelchair by left side  Neurological: She is alert.   Skin: Skin is warm and dry. She is not diaphoretic.     Labs- reviewed 05/29/14 k 5.1 05/26/14 wbc 10.9, hb 10.8, hct 35.6, plt 315, mcv 76.6, na 138, k 5.8, glu 76, bun 42, cr 1.73, alb 3.3, lft wnl, t.chol 115, tg 115, a1c 7.7 05/16/12 tsh 1.017, a1c 8.7   Assessment/plan  Diabetes mellitus with vascular  complications Reviewed a1c, goal 7. Continue lantus 30 u daily with lispro SSI with meals, continue aspirin, lisinopril and statin  Microcytic anemia Has history of iron deficiency anemia, reviewed h&h. Change her ferrex to 150 mg bid for now, check h&h in 3 months  Hyperlipidemia With her CVA hx, continue zocor 5 mg daily. Recent lipid panel reviewed, no ldl value reported. T.chol < 200.   Leukocytosis Mild, no signs of infection on exam, monitor clinically  Constipation Continue senokot s for now. Monitor bowel regimen as we have increased her iron dosing

## 2014-07-03 ENCOUNTER — Non-Acute Institutional Stay (SKILLED_NURSING_FACILITY): Payer: Medicare Other | Admitting: Internal Medicine

## 2014-07-03 DIAGNOSIS — I5032 Chronic diastolic (congestive) heart failure: Secondary | ICD-10-CM | POA: Diagnosis not present

## 2014-07-03 DIAGNOSIS — E039 Hypothyroidism, unspecified: Secondary | ICD-10-CM

## 2014-07-03 DIAGNOSIS — G4700 Insomnia, unspecified: Secondary | ICD-10-CM | POA: Diagnosis not present

## 2014-07-03 NOTE — Progress Notes (Signed)
Patient ID: Mia Calhoun, female   DOB: 01/13/1941, 74 y.o.   MRN: [id number]     [facility]ility[facility]  No Known Allergies  Code status full code  CC- routine visit  HPI 74 y/o patient is seen for routine. Visit. She has been at her baseline. No new concerns from staff. Weight is stable compared to last month. Denies any concerns. Continues to smoke. ROS limited with her aphasia  ROS No falls reported.   No new skin concerns.   Needs assistance with bathing, toileting, transfers and eating. Denies chest pain, trouble breathing, abdominal pain, nausea or vomiting Denies urinary complaints    Past medical history reviewed  Medication [facility]. [facility]  Physical exam BP 124/67 mmHg  Pulse 72  Temp(Src) 98.9 F (37.2 C)  Resp 18  Ht 5\' 7"  (1.702 m)  Wt 198 lb 1.6 oz (89.858 kg)  BMI 31.02 kg/m2  SpO2 97%  Wt Readings from Last 3 Encounters:  07/03/14 198 lb 1.6 oz (89.858 kg)  06/15/14 197 lb 9.6 oz (89.631 kg)  01/22/14 192 lb 3.2 oz (87.181 kg)    Constitutional: elderly patient in No distress. Expressive aphasia    Cardiovascular: Normal rate, regular rhythm and normal heart sounds. Weak dp pulses   Pulmonary/Chest: Effort normal and breath sounds normal. No respiratory distress. She exhibits no tenderness.   Abdominal: Bowel sounds are normal. She exhibits no distension and no mass.  Musculoskeletal: She exhibits no edema. Right sided hemiparesis, able to propel in wheelchair by left side  Neurological: She is alert.   Skin: Skin is warm and dry. She is not diaphoretic.     Labs- reviewed 05/29/14 k 5.1 05/26/14 wbc 10.9, hb 10.8, hct 35.6, plt 315, mcv 76.6, na 138, k 5.8, glu 76, bun 42, cr 1.73, alb 3.3, lft wnl, t.chol 115, tg 115, a1c 7.7 05/16/12 tsh 1.017, a1c 8.7   Assessment/plan  Hypothyroidism Stable, continue levothyroxine 50 mcg daily, check tsh  Insomnia Melatonin has been helpful, continue  this  chf euvolemic on exam, continue lasix 20mg  daily, aldactone 25 mg daily, toprol xl 50 mg daily and lisinopril 2.5 mg daily. Monitor weight

## 2014-08-03 ENCOUNTER — Encounter: Payer: Self-pay | Admitting: Internal Medicine

## 2014-08-03 ENCOUNTER — Non-Acute Institutional Stay (SKILLED_NURSING_FACILITY): Payer: Medicare Other | Admitting: Internal Medicine

## 2014-08-03 DIAGNOSIS — R05 Cough: Secondary | ICD-10-CM | POA: Diagnosis not present

## 2014-08-03 DIAGNOSIS — E1129 Type 2 diabetes mellitus with other diabetic kidney complication: Secondary | ICD-10-CM

## 2014-08-03 DIAGNOSIS — Z720 Tobacco use: Secondary | ICD-10-CM | POA: Diagnosis not present

## 2014-08-03 DIAGNOSIS — R059 Cough, unspecified: Secondary | ICD-10-CM

## 2014-08-03 DIAGNOSIS — E1165 Type 2 diabetes mellitus with hyperglycemia: Secondary | ICD-10-CM | POA: Diagnosis not present

## 2014-08-03 DIAGNOSIS — IMO0002 Reserved for concepts with insufficient information to code with codable children: Secondary | ICD-10-CM

## 2014-08-03 DIAGNOSIS — F17200 Nicotine dependence, unspecified, uncomplicated: Secondary | ICD-10-CM

## 2014-08-03 NOTE — Progress Notes (Signed)
Patient ID: Mia Calhoun, female   DOB: 08/21/1940, 74 y.o.   MRN: [id number]    [facility]  Code Status - Full Code  Chief Complaint  Patient presents with  . Medical Management of Chronic Issues    Routine Visit       No Known Allergies  HPI 74 y/o patient is seen for routine. Visit. She has been at her baseline. She has been coughing during her conversation today. No new concerns from staff. Denies any concerns. Continues to smoke. ROS limited with her aphasia  ROS No falls reported.   No new skin concerns.   Needs assistance with bathing, toileting, transfers and eating. Denies chest pain, trouble breathing, abdominal pain, nausea or vomiting Denies urinary complaints    Past medical history reviewed  Medication [facility]. [facility]   Medication List       This list is accurate as of: 08/03/14  6:43 PM.  Always use your most recent med list.               acetaminophen 650 MG CR tablet  Commonly known as:  TYLENOL  Take 650 mg by mouth every 6 (six) hours as needed for pain.     aspirin 81 MG tablet  Take 81 mg by mouth daily. Take 1 tablet daily to prevent heart attack and stroke.     CERTAGEN PO  Take 1 tablet by mouth daily.     cholecalciferol 1000 UNITS tablet  Commonly known as:  VITAMIN D  Take 2,000 Units by mouth daily.     furosemide 20 MG tablet  Commonly known as:  LASIX  Take 20 mg by mouth daily. Take 1 tablet daily for edema.     insulin glargine 100 UNIT/ML injection  Commonly known as:  LANTUS  Inject 30 Units into the skin at bedtime.     insulin lispro 100 UNIT/ML injection  Commonly known as:  HUMALOG  Sliding Scale >150 = 5 units, >250 = 8 units, > 350 units = 10 units, expires 28 days after opening     iron polysaccharides 150 MG capsule  Commonly known as:  NIFEREX  Take 150 mg by mouth daily. Take 1 tablet daily for anemia.     levothyroxine 50 MCG tablet  Commonly known as:  SYNTHROID,  LEVOTHROID  Take 50 mcg by mouth daily before breakfast. Take 1 tablet daily for thyroid therapy.     lisinopril 2.5 MG tablet  Commonly known as:  PRINIVIL,ZESTRIL  Take 2.5 mg by mouth daily. Take 1 tablet by mouth for HTN /renal protection.     Melatonin 3 MG Tabs  Take by mouth at bedtime.     metoprolol succinate 50 MG 24 hr tablet  Commonly known as:  TOPROL-XL  Take 50 mg by mouth daily. Take 1 tablet by mouth daily for HTN,  with or immediately following a meal.     sennosides-docusate sodium 8.6-50 MG tablet  Commonly known as:  SENOKOT-S  Take 1 tablet by mouth 2 (two) times daily. Take 1 tablet twice daily for constipation.     simvastatin 5 MG tablet  Commonly known as:  ZOCOR  Take 5 mg by mouth at bedtime. Take 1 tablet by mouth every night at bedtime for hyperlipidemia.     spironolactone 25 MG tablet  Commonly known as:  ALDACTONE  Take 25 mg by mouth daily.        Physical exam  BP 121/73 mmHg  Pulse 64  Temp(Src) 97.7 F (36.5 C) (Oral)  Resp 20  Ht  (1.702 m)  Wt 199 lb (90.266 kg)  BMI 31.16 kg/m2  SpO2 97%  Wt Readings from Last 3 Encounters:  08/03/14 199 lb (90.266 kg)  07/03/14 198 lb 1.6 oz (89.858 kg)  06/15/14 197 lb 9.6 oz (89.631 kg)    Constitutional: elderly patient in No distress. Expressive aphasia    Cardiovascular: Normal rate, regular rhythm and normal heart sounds. Weak dp pulses   Pulmonary/Chest: Effort normal and breath sounds normal. No respiratory distress. She exhibits no tenderness.   Throat: MMM, mild oropharyngeal erythema Abdominal: Bowel sounds are normal. She exhibits no distension and no mass.  Musculoskeletal: She exhibits no edema. Right sided hemiparesis, able to propel in wheelchair by left side  Neurological: She is alert.   Skin: Skin is warm and dry. She is not diaphoretic.     Labs- reviewed 05/29/14 k 5.1 05/26/14 wbc 10.9, hb 10.8, hct 35.6, plt 315, mcv 76.6, na 138, k 5.8, glu 76, bun 42, cr 1.73,  alb 3.3, lft wnl, t.chol 115, tg 115, a1c 7.7 05/16/12 tsh 1.017, a1c 8.7   Assessment/plan       Cough Robitussin 10 cc q8h x 5 days, then q8h prn. No chest congestion. Add prn albuterol if needed. If no improvement, consider adding a LABA/ LAMA with hx of smoking  Tobacco use Continues to smoke. 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 Continue lantus 35 u bid with premeal novlog. Continue lisinopril and zocor.    [name], MD  [facility] [phone] (Monday-Friday 8 am - 5 pm) [phone] (afterhours)

## 2014-08-04 LAB — HM DIABETES EYE EXAM

## 2014-09-15 ENCOUNTER — Non-Acute Institutional Stay (SKILLED_NURSING_FACILITY): Payer: Medicare Other | Admitting: Internal Medicine

## 2014-09-15 DIAGNOSIS — I5032 Chronic diastolic (congestive) heart failure: Secondary | ICD-10-CM | POA: Diagnosis not present

## 2014-09-15 DIAGNOSIS — N183 Chronic kidney disease, stage 3 (moderate): Secondary | ICD-10-CM

## 2014-09-15 DIAGNOSIS — I1310 Hypertensive heart and chronic kidney disease without heart failure, with stage 1 through stage 4 chronic kidney disease, or unspecified chronic kidney disease: Secondary | ICD-10-CM | POA: Insufficient documentation

## 2014-09-15 DIAGNOSIS — N189 Chronic kidney disease, unspecified: Secondary | ICD-10-CM | POA: Insufficient documentation

## 2014-09-15 DIAGNOSIS — I130 Hypertensive heart and chronic kidney disease with heart failure and stage 1 through stage 4 chronic kidney disease, or unspecified chronic kidney disease: Secondary | ICD-10-CM | POA: Diagnosis not present

## 2014-09-15 NOTE — Progress Notes (Signed)
Patient ID: Raiford Simmondsorothy A Castellana, female   DOB: 10/15/1940, 74 y.o.   MRN: [id number]      [facility]  Code Status - Full Code  Chief Complaint  Patient presents with  . Medical Management of Chronic Issues   No Known Allergies  HPI 74 y/o patient is seen for routine. Visit. She has been at her baseline and denies any concerns. No new concerns from staff. Continues to smoke. ROS limited with her aphasia  ROS No falls reported.   No new skin concerns.   Needs assistance with bathing, toileting, transfers and eating. Denies chest pain, trouble breathing, abdominal pain, nausea or vomiting Denies urinary complaints    Past medical history reviewed  Medication   Medication List       This list is accurate as of: 09/15/14  5:25 PM.  Always use your most recent med list.               acetaminophen 650 MG CR tablet  Commonly known as:  TYLENOL  Take 650 mg by mouth every 6 (six) hours as needed for pain.     aspirin 81 MG tablet  Take 81 mg by mouth daily. Take 1 tablet daily to prevent heart attack and stroke.     CERTAGEN PO  Take 1 tablet by mouth daily.     cholecalciferol 1000 UNITS tablet  Commonly known as:  VITAMIN D  Take 2,000 Units by mouth daily.     furosemide 20 MG tablet  Commonly known as:  LASIX  Take 20 mg by mouth daily. Take 1 tablet daily for edema.     insulin glargine 100 UNIT/ML injection  Commonly known as:  LANTUS  Inject 30 Units into the skin at bedtime.     insulin lispro 100 UNIT/ML injection  Commonly known as:  HUMALOG  Sliding Scale >150 = 5 units, >250 = 8 units, > 350 units = 10 units, expires 28 days after opening     iron polysaccharides 150 MG capsule  Commonly known as:  NIFEREX  Take 150 mg by mouth daily. Take 1 tablet daily for anemia.     levothyroxine 50 MCG tablet  Commonly known as:  SYNTHROID, LEVOTHROID  Take 50 mcg by mouth daily before breakfast. Take 1 tablet daily for thyroid  therapy.     lisinopril 2.5 MG tablet  Commonly known as:  PRINIVIL,ZESTRIL  Take 2.5 mg by mouth daily. Take 1 tablet by mouth for HTN /renal protection.     Melatonin 3 MG Tabs  Take by mouth at bedtime.     metoprolol succinate 50 MG 24 hr tablet  Commonly known as:  TOPROL-XL  Take 50 mg by mouth daily. Take 1 tablet by mouth daily for HTN,  with or immediately following a meal.     sennosides-docusate sodium 8.6-50 MG tablet  Commonly known as:  SENOKOT-S  Take 1 tablet by mouth 2 (two) times daily. Take 1 tablet twice daily for constipation.     simvastatin 5 MG tablet  Commonly known as:  ZOCOR  Take 5 mg by mouth at bedtime. Take 1 tablet by mouth every night at bedtime for hyperlipidemia.     spironolactone 25 MG tablet  Commonly known as:  ALDACTONE  Take 25 mg by mouth daily.       Physical exam BP 132/78 mmHg  Pulse 75  Temp(Src) 98.5 F (36.9 C)  Resp 18  SpO2 99%  Wt Readings from Last 3 Encounters:  08/03/14 199 lb (90.266 kg)  07/03/14 198 lb 1.6 oz (89.858 kg)  06/15/14 197 lb 9.6 oz (89.631 kg)   constitutional: elderly patient in No distress. Expressive aphasia    Cardiovascular: Normal rate, regular rhythm and normal heart sounds. Weak dp pulses   Pulmonary/Chest: Effort normal and breath sounds normal. No respiratory distress. She exhibits no tenderness.   Throat: MMM, mild oropharyngeal erythema Abdominal: Bowel sounds are normal. She exhibits no distension and no mass.  Musculoskeletal: She exhibits no edema. Right sided hemiparesis, able to propel in wheelchair by left side  Neurological: She is alert.   Skin: Skin is warm and dry. She is not diaphoretic.     Labs- reviewed 05/29/14 k 5.1 05/26/14 wbc 10.9, hb 10.8, hct 35.6, plt 315, mcv 76.6, na 138, k 5.8, glu 76, bun 42, cr 1.73, alb 3.3, lft wnl, t.chol 115, tg 115, a1c 7.7 05/16/12 tsh 1.017, a1c 8.7   Assessment/plan  Hypertensive heart and renal disease Stable, continue  lisinopril, lasix, aldactone and toprol xl, no change made to dosing this visit. No signs of fluid overload on exam. bp at goal  ckd stage 3 Continue lisinopril 2.5 mg daily, monitor bmp  chf Euvolemic, no dyspnea or edema. Continue coreg and lisinopril with aldactone and lasix, monitor clinically  [name], MD  [facility] [phone] (Monday-Friday 8 am - 5 pm) [phone] (afterhours)

## 2014-10-20 ENCOUNTER — Non-Acute Institutional Stay (SKILLED_NURSING_FACILITY): Payer: Medicare Other | Admitting: Internal Medicine

## 2014-10-20 DIAGNOSIS — Z720 Tobacco use: Secondary | ICD-10-CM

## 2014-10-20 DIAGNOSIS — E11349 Type 2 diabetes mellitus with severe nonproliferative diabetic retinopathy without macular edema: Secondary | ICD-10-CM | POA: Diagnosis not present

## 2014-10-20 DIAGNOSIS — F17200 Nicotine dependence, unspecified, uncomplicated: Secondary | ICD-10-CM

## 2014-10-20 DIAGNOSIS — N189 Chronic kidney disease, unspecified: Secondary | ICD-10-CM | POA: Diagnosis not present

## 2014-10-20 DIAGNOSIS — E1122 Type 2 diabetes mellitus with diabetic chronic kidney disease: Secondary | ICD-10-CM

## 2014-10-20 DIAGNOSIS — E113499 Type 2 diabetes mellitus with severe nonproliferative diabetic retinopathy without macular edema, unspecified eye: Secondary | ICD-10-CM

## 2014-10-20 DIAGNOSIS — J439 Emphysema, unspecified: Secondary | ICD-10-CM

## 2014-10-20 DIAGNOSIS — E1151 Type 2 diabetes mellitus with diabetic peripheral angiopathy without gangrene: Secondary | ICD-10-CM | POA: Diagnosis not present

## 2014-10-20 DIAGNOSIS — N183 Chronic kidney disease, stage 3 unspecified: Secondary | ICD-10-CM

## 2014-10-20 NOTE — Progress Notes (Signed)
Patient ID: Mia Calhoun, female   DOB: [birth year], 74 y.o.   MRN: [id number]      [facility]  Code Status - Full Code  Chief Complaint  Patient presents with  . Medical Management of Chronic Issues   No Known Allergies  HPI 74 y/o patient seen for monthly visit. She has been at her baseline and denies any concerns. No new concerns from staff. Continues to smoke. ROS limited with her aphasia  ROS Needs assistance with bathing, toileting, transfers Denies chest pain, trouble breathing, abdominal pain, nausea or vomiting Denies urinary complaints  No falls reported.   No new skin concerns.    Past medical history reviewed  Medication   Medication List       This list is accurate as of: 10/20/14  4:51 PM.  Always use your most recent med list.               acetaminophen 650 MG CR tablet  Commonly known as:  TYLENOL  Take 650 mg by mouth every 6 (six) hours as needed for pain.     aspirin 81 MG tablet  Take 81 mg by mouth daily. Take 1 tablet daily to prevent heart attack and stroke.     CERTAGEN PO  Take 1 tablet by mouth daily.     cholecalciferol 1000 UNITS tablet  Commonly known as:  VITAMIN D  Take 2,000 Units by mouth daily.     furosemide 20 MG tablet  Commonly known as:  LASIX  Take 20 mg by mouth daily. Take 1 tablet daily for edema.     insulin glargine 100 UNIT/ML injection  Commonly known as:  LANTUS  Inject 30 Units into the skin at bedtime.     insulin lispro 100 UNIT/ML injection  Commonly known as:  HUMALOG  Sliding Scale >150 = 5 units, >250 = 8 units, > 350 units = 10 units, expires 28 days after opening     iron polysaccharides 150 MG capsule  Commonly known as:  NIFEREX  Take 150 mg by mouth daily. Take 1 tablet daily for anemia.     levothyroxine 50 MCG tablet  Commonly known as:  SYNTHROID, LEVOTHROID  Take 50 mcg by mouth daily before breakfast. Take 1 tablet daily for thyroid therapy.     lisinopril 2.5 MG tablet  Commonly known as:  PRINIVIL,ZESTRIL  Take 2.5 mg by mouth daily. Take 1 tablet by mouth for HTN /renal protection.     Melatonin 3 MG Tabs  Take by mouth at bedtime.     metoprolol succinate 50 MG 24 hr tablet  Commonly known as:  TOPROL-XL  Take 50 mg by mouth daily. Take 1 tablet by mouth daily for HTN,  with or immediately following a meal.     sennosides-docusate sodium 8.6-50 MG tablet  Commonly known as:  SENOKOT-S  Take 1 tablet by mouth 2 (two) times daily. Take 1 tablet twice daily for constipation.     simvastatin 5 MG tablet  Commonly known as:  ZOCOR  Take 5 mg by mouth at bedtime. Take 1 tablet by mouth every night at bedtime for hyperlipidemia.     spironolactone 25 MG tablet  Commonly known as:  ALDACTONE  Take 25 mg by mouth daily.       Physical exam BP 110/86 mmHg  Pulse 80  Temp(Src) 97.8 F (36.6 C)  Resp 18  Wt 200 lb 12.8 oz (91.082 kg)  Wt Readings from Last 3 Encounters:  10/20/14 200 lb 12.8 oz (91.082 kg)  08/03/14 199 lb (90.266 kg)  07/03/14 198 lb 1.6 oz (89.858 kg)   constitutional: elderly patient in No distress. Expressive aphasia    Throat: MMM Cardiovascular: Normal rate, regular rhythm and normal heart sounds. Weak dp pulses   Pulmonary/Chest: Effort normal and breath sounds normal. No respiratory distress.  Abdominal: Bowel sounds are normal. She exhibits no distension and no mass.  Musculoskeletal: She exhibits no edema. Right sided hemiparesis, able to propel in wheelchair by left side  Neurological: She is alert.   Skin: Skin is warm and dry. She is not diaphoretic.     Labs- reviewed 05/29/14 k 5.1 05/26/14 wbc 10.9, hb 10.8, hct 35.6, plt 315, mcv 76.6, na 138, k 5.8, glu 76, bun 42, cr 1.73, alb 3.3, lft wnl, t.chol 115, tg 115, a1c 7.7 05/16/12 tsh 1.017, a1c 8.7   Assessment/plan  Type 2 dm With ckd. Continue lantus 30 u and SSI humalog with lisinopril 2.5 mg daily. bp reading stable. cbg  reviewed and stable. Continue zocor. Pt has been refusing lab draws. counselled on smoking cessation and increased risk of PVD with it. Check a1c and lipid panel, pt agrees for lab work  ckd stage 3 Continue lisinopril and monitor renal function  Tobacco use counselled on smoking cessation  Diabetic retinopathy Followed by ophthalmology. Continue her statin and dm medication  Diabetic peripheral angiopathy continue baby aspirin, high risk of worsening with her smoking.   Copd Breathing stable, decline anticipated with her ongoing tobacco use. No recent cough or dyspnea reported. Monitor clinically  Labs- cbc, cmp, a1c, lipid panel  [name], MD  [facility] [phone] (Monday-Friday 8 am - 5 pm) [phone] (afterhours)

## 2014-11-17 ENCOUNTER — Non-Acute Institutional Stay (SKILLED_NURSING_FACILITY): Payer: Medicare Other | Admitting: Internal Medicine

## 2014-11-17 DIAGNOSIS — I130 Hypertensive heart and chronic kidney disease with heart failure and stage 1 through stage 4 chronic kidney disease, or unspecified chronic kidney disease: Secondary | ICD-10-CM | POA: Diagnosis not present

## 2014-11-17 DIAGNOSIS — I5032 Chronic diastolic (congestive) heart failure: Secondary | ICD-10-CM | POA: Diagnosis not present

## 2014-11-17 DIAGNOSIS — N183 Chronic kidney disease, stage 3 unspecified: Secondary | ICD-10-CM

## 2014-11-17 NOTE — Progress Notes (Signed)
Patient ID: Raiford Simmonds, female   DOB: 1940-09-05, 74 y.o.   MRN: [id number]      [facility]  Code Status - Full Code  Chief Complaint  Patient presents with  . Medical Management of Chronic Issues   No Known Allergies  HPI 74 y/o patient seen for monthly visit. She has been at her baseline. No new concern form patient or staff. No falls reported. No new skin concern. Has been refusing blood work  ROS Needs assistance with bathing, toileting, transfers Denies chest pain, trouble breathing, abdominal pain, nausea or vomiting Denies urinary complaints    Past medical history reviewed  Medication   Medication List       This list is accurate as of: 11/17/14  4:46 PM.  Always use your most recent med list.               acetaminophen 650 MG CR tablet  Commonly known as:  TYLENOL  Take 650 mg by mouth every 6 (six) hours as needed for pain.     aspirin 81 MG tablet  Take 81 mg by mouth daily. Take 1 tablet daily to prevent heart attack and stroke.     CERTAGEN PO  Take 1 tablet by mouth daily.     cholecalciferol 1000 UNITS tablet  Commonly known as:  VITAMIN D  Take 2,000 Units by mouth daily.     furosemide 20 MG tablet  Commonly known as:  LASIX  Take 20 mg by mouth daily. Take 1 tablet daily for edema.     insulin glargine 100 UNIT/ML injection  Commonly known as:  LANTUS  Inject 30 Units into the skin at bedtime.     insulin lispro 100 UNIT/ML injection  Commonly known as:  HUMALOG  Sliding Scale >150 = 5 units, >250 = 8 units, > 350 units = 10 units, expires 28 days after opening     iron polysaccharides 150 MG capsule  Commonly known as:  NIFEREX  Take 150 mg by mouth daily. Take 1 tablet daily for anemia.     levothyroxine 50 MCG tablet  Commonly known as:  SYNTHROID, LEVOTHROID  Take 50 mcg by mouth daily before breakfast. Take 1 tablet daily for thyroid therapy.     lisinopril 2.5 MG tablet  Commonly known  as:  PRINIVIL,ZESTRIL  Take 2.5 mg by mouth daily. Take 1 tablet by mouth for HTN /renal protection.     Melatonin 3 MG Tabs  Take by mouth at bedtime.     metoprolol succinate 50 MG 24 hr tablet  Commonly known as:  TOPROL-XL  Take 50 mg by mouth daily. Take 1 tablet by mouth daily for HTN,  with or immediately following a meal.     sennosides-docusate sodium 8.6-50 MG tablet  Commonly known as:  SENOKOT-S  Take 1 tablet by mouth 2 (two) times daily. Take 1 tablet twice daily for constipation.     simvastatin 5 MG tablet  Commonly known as:  ZOCOR  Take 5 mg by mouth at bedtime. Take 1 tablet by mouth every night at bedtime for hyperlipidemia.     spironolactone 25 MG tablet  Commonly known as:  ALDACTONE  Take 25 mg by mouth daily.       Physical exam BP 121/72 mmHg  Pulse 88  Temp(Src) 97.3 F (36.3 C)  Resp 18  SpO2 96%  Wt Readings from Last 3 Encounters:  10/20/14 200 lb 12.8  oz (91.082 kg)  08/03/14 199 lb (90.266 kg)  07/03/14 198 lb 1.6 oz (89.858 kg)   constitutional: elderly patient with expressive aphasia    Head: atraumatic, normocephalic Throat: MMM Nose: no sinus tenderness, no nasal discharge Cardiovascular: Normal rate, regular rhythm and normal heart sounds. Weak dp pulses   Pulmonary/Chest: Effort normal and breath sounds normal. No respiratory distress.  Abdominal: Bowel sounds are normal. She exhibits no distension and no mass.  Musculoskeletal: She exhibits no edema. Right sided hemiparesis, able to propel in wheelchair by left side  Neurological: She is alert.   Skin: Skin is warm and dry. She is not diaphoretic.     Labs- reviewed 05/29/14 k 5.1 05/26/14 wbc 10.9, hb 10.8, hct 35.6, plt 315, mcv 76.6, na 138, k 5.8, glu 76, bun 42, cr 1.73, alb 3.3, lft wnl, t.chol 115, tg 115, a1c 7.7 05/16/12 tsh 1.017, a1c 8.7   Assessment/plan  Hypertension Stable bp reading on review. Continue lisinopril 2.5 mg daily, toprol xl 50 mg daily, aldactone  25 mg daily and monitor bp  Chronic diastolic chf Stable, continue b blocker, hydralazine, aldactone and lasix, no changes made, monitor weight. weight 194 lb this visit.  ckd stage 3 Continue lisinopril and monitor renal function   [name], MD  [facility] [phone] (Monday-Friday 8 am - 5 pm) [phone] (afterhours)

## 2015-01-06 ENCOUNTER — Non-Acute Institutional Stay (SKILLED_NURSING_FACILITY): Payer: Medicare Other | Admitting: Internal Medicine

## 2015-01-06 DIAGNOSIS — D509 Iron deficiency anemia, unspecified: Secondary | ICD-10-CM

## 2015-01-06 DIAGNOSIS — E261 Secondary hyperaldosteronism: Secondary | ICD-10-CM | POA: Diagnosis not present

## 2015-01-06 DIAGNOSIS — E785 Hyperlipidemia, unspecified: Secondary | ICD-10-CM | POA: Diagnosis not present

## 2015-01-06 NOTE — Progress Notes (Signed)
Patient ID: Mia Calhoun, female   DOB: 1940/08/24, 74 y.o.   MRN: [id number]     [facility]  Code Status - Full Code  Chief Complaint  Patient presents with  . Medical Management of Chronic Issues   No Known Allergies  HPI 74 y/o patient seen for monthly visit. She has been at her baseline. No new concern form patient or staff. No falls reported. No new skin concern. Needs assistance with bathing, toileting, transfers.  ROS- limited with her aphasia Denies chest pain, trouble breathing, abdominal pain, nausea or vomiting Denies urinary complaints   Past medical history reviewed  Medication   Medication List       This list is accurate as of: 01/06/15  5:01 PM.  Always use your most recent med list.               acetaminophen 650 MG CR tablet  Commonly known as:  TYLENOL  Take 650 mg by mouth every 6 (six) hours as needed for pain.     aspirin 81 MG tablet  Take 81 mg by mouth daily. Take 1 tablet daily to prevent heart attack and stroke.     CERTAGEN PO  Take 1 tablet by mouth daily.     cholecalciferol 1000 UNITS tablet  Commonly known as:  VITAMIN D  Take 2,000 Units by mouth daily.     furosemide 20 MG tablet  Commonly known as:  LASIX  Take 20 mg by mouth daily. Take 1 tablet daily for edema.     insulin glargine 100 UNIT/ML injection  Commonly known as:  LANTUS  Inject 30 Units into the skin at bedtime.     insulin lispro 100 UNIT/ML injection  Commonly known as:  HUMALOG  Sliding Scale >150 = 5 units, >250 = 8 units, > 350 units = 10 units, expires 28 days after opening     iron polysaccharides 150 MG capsule  Commonly known as:  NIFEREX  Take 150 mg by mouth daily. Take 1 tablet daily for anemia.     levothyroxine 50 MCG tablet  Commonly known as:  SYNTHROID, LEVOTHROID  Take 50 mcg by mouth daily before breakfast. Take 1 tablet daily for thyroid therapy.     lisinopril 2.5 MG tablet  Commonly known as:   PRINIVIL,ZESTRIL  Take 2.5 mg by mouth daily. Take 1 tablet by mouth for HTN /renal protection.     Melatonin 3 MG Tabs  Take by mouth at bedtime.     metoprolol succinate 50 MG 24 hr tablet  Commonly known as:  TOPROL-XL  Take 50 mg by mouth daily. Take 1 tablet by mouth daily for HTN,  with or immediately following a meal.     sennosides-docusate sodium 8.6-50 MG tablet  Commonly known as:  SENOKOT-S  Take 1 tablet by mouth 2 (two) times daily. Take 1 tablet twice daily for constipation.     simvastatin 5 MG tablet  Commonly known as:  ZOCOR  Take 5 mg by mouth at bedtime. Take 1 tablet by mouth every night at bedtime for hyperlipidemia.     spironolactone 25 MG tablet  Commonly known as:  ALDACTONE  Take 25 mg by mouth daily.        Physical exam BP 132/78 mmHg  Pulse 72  Temp(Src) 98.8 F (37.1 C)  Resp 18  Wt 194 lb 12.8 oz (88.361 kg)  SpO2 97%  Wt Readings from Last 3  Encounters:  01/06/15 194 lb 12.8 oz (88.361 kg)  10/20/14 200 lb 12.8 oz (91.082 kg)  08/03/14 199 lb (90.266 kg)   constitutional: elderly patient with expressive aphasia    Head: atraumatic, normocephalic Throat: MMM Nose: no sinus tenderness, no nasal discharge Cardiovascular: Normal rate, regular rhythm and normal heart sounds. Weak dp pulses   Pulmonary/Chest: Effort normal and breath sounds normal. No respiratory distress.  Abdominal: Bowel sounds are normal. She exhibits no distension and no mass.  Musculoskeletal: She exhibits no edema. Right sided hemiparesis, able to propel in wheelchair by left side  Neurological: She is alert.   Skin: Skin is warm and dry. She is not diaphoretic.     Labs- reviewed 05/29/14 k 5.1 05/26/14 wbc 10.9, hb 10.8, hct 35.6, plt 315, mcv 76.6, na 138, k 5.8, glu 76, bun 42, cr 1.73, alb 3.3, lft wnl, t.chol 115, tg 115, a1c 7.7   Assessment/plan  HLD Continue zocor 5 mg daily with baby aspirin 81 mg daily  Secondary hyperaldosteronism Continue lasix  and aldactone. Has hx of chf with edema.  Iron def anemia Continue niferex for now. Refuses blood work for follow up   Oneal GroutMAHIMA Aeron Donaghey, MD  [facility] [phone] (Monday-Friday 8 am - 5 pm) [phone] (afterhours)

## 2015-01-15 LAB — MICROALBUMIN, URINE: MICROALB UR: 0.7

## 2015-03-30 ENCOUNTER — Encounter: Payer: Self-pay | Admitting: Internal Medicine

## 2015-03-30 ENCOUNTER — Non-Acute Institutional Stay (SKILLED_NURSING_FACILITY): Payer: Medicare Other | Admitting: Internal Medicine

## 2015-03-30 DIAGNOSIS — E46 Unspecified protein-calorie malnutrition: Secondary | ICD-10-CM | POA: Diagnosis not present

## 2015-03-30 DIAGNOSIS — E785 Hyperlipidemia, unspecified: Secondary | ICD-10-CM

## 2015-03-30 DIAGNOSIS — R0981 Nasal congestion: Secondary | ICD-10-CM | POA: Diagnosis not present

## 2015-03-30 DIAGNOSIS — E1122 Type 2 diabetes mellitus with diabetic chronic kidney disease: Secondary | ICD-10-CM

## 2015-03-30 DIAGNOSIS — I10 Essential (primary) hypertension: Secondary | ICD-10-CM

## 2015-03-30 DIAGNOSIS — J069 Acute upper respiratory infection, unspecified: Secondary | ICD-10-CM

## 2015-03-30 DIAGNOSIS — Z794 Long term (current) use of insulin: Secondary | ICD-10-CM | POA: Diagnosis not present

## 2015-03-30 DIAGNOSIS — F17200 Nicotine dependence, unspecified, uncomplicated: Secondary | ICD-10-CM

## 2015-03-30 DIAGNOSIS — R634 Abnormal weight loss: Secondary | ICD-10-CM | POA: Diagnosis not present

## 2015-03-30 NOTE — Progress Notes (Signed)
Patient ID: Mia Calhoun, female   DOB: 06-Nov-1940, 75 y.o.   MRN: [id number]     [facility]  Code Status - Full Code  Chief Complaint  Patient presents with  . Medical Management of Chronic Issues    Routine Visit   No Known Allergies  HPI 75 y/o patient seen for routine visit. She is in her bed and in no distress. Per staff, she has been refusing treatment at times. She has been at her baseline otherwise. No new concern form patient. She has been losing weight. She was treated for acute bronchitis in the beginning of this month with antibiotic and is now on flonase for her nasal congestion. No falls reported. No new skin concern. Needs assistance with bathing, toileting, transfers.  ROS- limited with her aphasia Denies chest pain, trouble breathing, abdominal pain, nausea or vomiting Denies urinary complaints  Continues to smoke  Past medical history reviewed  Medication   Medication List       This list is accurate as of: 03/30/15 11:05 AM.  Always use your most recent med list.               acetaminophen 650 MG CR tablet  Commonly known as:  TYLENOL  Take 650 mg by mouth every 6 (six) hours as needed for pain.     aspirin 81 MG tablet  Take 81 mg by mouth daily. Take 1 tablet daily to prevent heart attack and stroke.     CERTAGEN PO  Take 1 tablet by mouth daily.     cholecalciferol 1000 units tablet  Commonly known as:  VITAMIN D  Take 2,000 Units by mouth daily.     fluticasone 50 MCG/ACT nasal spray  Commonly known as:  FLONASE  Place 2 sprays into both nostrils 2 (two) times daily.     furosemide 20 MG tablet  Commonly known as:  LASIX  Take 20 mg by mouth daily. Take 1 tablet daily for edema.     guaifenesin 100 MG/5ML syrup  Commonly known as:  ROBITUSSIN  Take 100 mg by mouth every 8 (eight) hours as needed for cough.     insulin glargine 100 UNIT/ML injection  Commonly known as:  LANTUS  Inject 30 Units  into the skin at bedtime.     insulin lispro 100 UNIT/ML injection  Commonly known as:  HUMALOG  Sliding Scale >150 = 5 units, >250 = 8 units, > 350 units = 10 units, expires 28 days after opening     iron polysaccharides 150 MG capsule  Commonly known as:  NIFEREX  Take 150 mg by mouth daily. Take 1 tablet daily for anemia.     levothyroxine 50 MCG tablet  Commonly known as:  SYNTHROID, LEVOTHROID  Take 50 mcg by mouth daily before breakfast. Take 1 tablet daily for thyroid therapy.     lisinopril 2.5 MG tablet  Commonly known as:  PRINIVIL,ZESTRIL  Take 2.5 mg by mouth daily. Take 1 tablet by mouth for HTN /renal protection.     Melatonin 3 MG Tabs  Take by mouth at bedtime.     metoprolol succinate 50 MG 24 hr tablet  Commonly known as:  TOPROL-XL  Take 50 mg by mouth daily. Take 1 tablet by mouth daily for HTN,  with or immediately following a meal.     polyvinyl alcohol 1.4 % ophthalmic solution  Commonly known as:  LIQUIFILM TEARS  1 drop 4 (  four) times daily as needed for dry eyes. Wait 3-5 minutes between 2 eye meds     sennosides-docusate sodium 8.6-50 MG tablet  Commonly known as:  SENOKOT-S  Take 1 tablet by mouth 2 (two) times daily. Take 1 tablet twice daily for constipation.     simvastatin 5 MG tablet  Commonly known as:  ZOCOR  Take 5 mg by mouth at bedtime. Take 1 tablet by mouth every night at bedtime for hyperlipidemia.     spironolactone 25 MG tablet  Commonly known as:  ALDACTONE  Take 25 mg by mouth daily.        Physical exam BP 146/68 mmHg  Pulse 90  Temp(Src) 98.5 F (36.9 C) (Oral)  Resp 20  Ht  (1.702 m)  Wt 192 lb 12.8 oz (87.454 kg)  BMI 30.19 kg/m2  SpO2 97%  Wt Readings from Last 3 Encounters:  03/30/15 192 lb 12.8 oz (87.454 kg)  01/06/15 194 lb 12.8 oz (88.361 kg)  10/20/14 200 lb 12.8 oz (91.082 kg)   Constitutional: elderly patient with expressive aphasia    Head: atraumatic, normocephalic Throat: MMM Nose: no  sinus tenderness, no nasal discharge Cardiovascular: Normal rate, regular rhythm and normal heart sounds. Weak dp pulses   Pulmonary/Chest: Effort normal. Poor air entry. No wheeze or rhonchi or crackles. No respiratory distress.  Abdominal: Bowel sounds are normal. She exhibits no distension and no mass.  Musculoskeletal: She exhibits no edema. Right sided hemiparesis, able to propel in wheelchair by left side  Neurological: She is alert.  kyphosis + Skin: Skin is warm and dry. She is not diaphoretic.     Labs-   05/29/14 k 5.1 05/26/14 wbc 10.9, hb 10.8, hct 35.6, plt 315, mcv 76.6, na 138, k 5.8, glu 76, bun 42, cr 1.73, alb 3.3, lft wnl, t.chol 115, tg 115, a1c 7.7   Assessment/plan  Nasal congestion Continue flonase and has tolerated it well  Tobacco use Continues to smoke. Has chronic cough. Get chest xray to assess for chronic emphysematous changes and get pulmonary consult to get further evaluation for COPD  Weight loss Noted on chart review. Has muscle wasting. Get dietary consult. Monitor weekly weight. Does not want to quit smoking. A low resolution CT scan of chest to screen for lung cancer would help. Order this  Protein calorie malnutrition Get dietary consult  URI Improved, completed course of azithromycin. Monitor clinically  DM Currently on lantus 30 u qhs and SSI humalog. Check a1c and lipid panel. Continue aspirin and lisinopril. No microalbuminuria.   HLD Continue zocor 5 mg daily with baby aspirin 81 mg daily, check lipid panel  HTN Monitor bp, medication compliance reinforced. Continue lisinopril 2.5 mg daily with metoprolol and aldactone along with lasix. Check cmp   [name], MD  [facility] Adult [facility] [phone] (Monday-Friday 8 am - 5 pm) [phone] (afterhours)

## 2015-04-01 ENCOUNTER — Other Ambulatory Visit: Payer: Self-pay | Admitting: Internal Medicine

## 2015-04-05 ENCOUNTER — Other Ambulatory Visit: Payer: Self-pay | Admitting: Internal Medicine

## 2015-04-05 DIAGNOSIS — F17200 Nicotine dependence, unspecified, uncomplicated: Secondary | ICD-10-CM

## 2015-04-08 ENCOUNTER — Ambulatory Visit
Admission: RE | Admit: 2015-04-08 | Discharge: 2015-04-08 | Disposition: A | Discharge status: Home / Self Care | Payer: Medicare Other | Source: Ambulatory Visit | Attending: Internal Medicine | Admitting: Internal Medicine

## 2015-04-08 DIAGNOSIS — F17200 Nicotine dependence, unspecified, uncomplicated: Secondary | ICD-10-CM

## 2015-04-21 ENCOUNTER — Encounter: Payer: Self-pay | Admitting: Internal Medicine

## 2015-04-21 ENCOUNTER — Non-Acute Institutional Stay (SKILLED_NURSING_FACILITY): Payer: Medicare Other | Admitting: Internal Medicine

## 2015-04-21 DIAGNOSIS — I5032 Chronic diastolic (congestive) heart failure: Secondary | ICD-10-CM

## 2015-04-21 DIAGNOSIS — E46 Unspecified protein-calorie malnutrition: Secondary | ICD-10-CM

## 2015-04-21 DIAGNOSIS — K5900 Constipation, unspecified: Secondary | ICD-10-CM

## 2015-04-21 DIAGNOSIS — K5909 Other constipation: Secondary | ICD-10-CM

## 2015-04-21 NOTE — Progress Notes (Signed)
Patient ID: Mia Calhoun, female   DOB: 1941-01-10, 75 y.o.   MRN: [id number]     [facility]  Code Status - Full Code  Chief Complaint  Patient presents with  . Medical Management of Chronic Issues    Routine Visit   No Known Allergies  HPI 75 y/o patient seen for routine visit. She has been at her baseline. No new concern from staff. Continues to lose weight. She has been refusing lab work and refuses medication and care at times.   ROS- limited with her aphasia Denies chest pain, trouble breathing, abdominal pain, nausea or vomiting Denies urinary complaints  Continues to smoke  Past medical history reviewed  Medication   Medication List       This list is accurate as of: 04/21/15  3:15 PM.  Always use your most recent med list.               acetaminophen 650 MG CR tablet  Commonly known as:  TYLENOL  Take 650 mg by mouth every 6 (six) hours as needed for pain.     aspirin 81 MG tablet  Take 81 mg by mouth daily. Take 1 tablet daily to prevent heart attack and stroke.     CERTAVITE/ANTIOXIDANTS Tabs  Take 1 tablet by mouth daily.     cholecalciferol 1000 units tablet  Commonly known as:  VITAMIN D  Take 2,000 Units by mouth daily.     fluticasone 50 MCG/ACT nasal spray  Commonly known as:  FLONASE  Place 2 sprays into both nostrils 2 (two) times daily.     furosemide 20 MG tablet  Commonly known as:  LASIX  Take 20 mg by mouth daily. Take 1 tablet daily for edema.     guaifenesin 100 MG/5ML syrup  Commonly known as:  ROBITUSSIN  Take 100 mg by mouth every 8 (eight) hours as needed for cough. Take 10 mL by mouth every 8 hours as needed for cough     insulin glargine 100 UNIT/ML injection  Commonly known as:  LANTUS  Inject 30 Units into the skin at bedtime.     insulin lispro 100 UNIT/ML injection  Commonly known as:  HUMALOG  Inject 8 Units into the skin 2 (two) times daily. 7 am and 4:30 pm. Sliding Scale >150  = 5 units, >250 = 8 units, > 350 units = 10 units, expires 28 days after opening     iron polysaccharides 150 MG capsule  Commonly known as:  NIFEREX  Take 150 mg by mouth 2 (two) times daily. Take 1 tablet daily for anemia.     levothyroxine 50 MCG tablet  Commonly known as:  SYNTHROID, LEVOTHROID  Take 50 mcg by mouth daily before breakfast. Take 1 tablet daily for thyroid therapy.     lisinopril 2.5 MG tablet  Commonly known as:  PRINIVIL,ZESTRIL  Take 2.5 mg by mouth daily. Take 1 tablet by mouth for HTN /renal protection.     Melatonin 3 MG Tabs  Take 3 mg by mouth at bedtime. Take 30 minutes before bedtime     metoprolol succinate 50 MG 24 hr tablet  Commonly known as:  TOPROL-XL  Take 50 mg by mouth daily. Take 1 tablet by mouth daily for HTN,  with or immediately following a meal.     polyvinyl alcohol 1.4 % ophthalmic solution  Commonly known as:  LIQUIFILM TEARS  1 drop 4 (four) times daily as  needed for dry eyes. Wait 3-5 minutes between 2 eye meds     sennosides-docusate sodium 8.6-50 MG tablet  Commonly known as:  SENOKOT-S  Take 1 tablet by mouth 2 (two) times daily. Take 1 tablet twice daily for constipation.     simvastatin 5 MG tablet  Commonly known as:  ZOCOR  Take 5 mg by mouth at bedtime. Take 1 tablet by mouth every night at bedtime for hyperlipidemia.     spironolactone 25 MG tablet  Commonly known as:  ALDACTONE  Take 25 mg by mouth daily.        Physical exam BP 132/76 mmHg  Pulse 73  Temp(Src) 97.6 F (36.4 C) (Oral)  Resp 20  Ht  (1.702 m)  Wt 189 lb 3.2 oz (85.821 kg)  BMI 29.63 kg/m2  SpO2 98%  Wt Readings from Last 3 Encounters:  04/21/15 189 lb 3.2 oz (85.821 kg)  03/30/15 192 lb 12.8 oz (87.454 kg)  01/06/15 194 lb 12.8 oz (88.361 kg)   Constitutional: elderly thin built and frail patient with expressive aphasia    Head: atraumatic, normocephalic Throat: MMM Nose: no sinus tenderness, no nasal discharge Cardiovascular:  Normal rate, regular rhythm and normal heart sounds. Weak dp pulses   Pulmonary/Chest: Effort normal. Poor air entry. No wheeze or rhonchi or crackles. No respiratory distress.  Abdominal: Bowel sounds are normal. She exhibits no distension and no mass.  Musculoskeletal: She exhibits no edema. Right sided hemiparesis, able to propel in wheelchair by left side  Neurological: She is alert. Can make her needs known.  kyphosis + Skin: Skin is warm and dry. She is not diaphoretic.     Labs-   CBC Latest Ref Rng 05/19/2008  WBC 4.0 - 10.5 K/uL 5.2  Hemoglobin 12.0 - 15.0 g/dL 16.1  Hematocrit 09.6 - 46.0 % 39.2  Platelets 150 - 400 K/uL 239   05/29/14 k 5.1 05/26/14 wbc 10.9, hb 10.8, hct 35.6, plt 315, mcv 76.6, na 138, k 5.8, glu 76, bun 42, cr 1.73, alb 3.3, lft wnl, t.chol 115, tg 115, a1c 7.7   Assessment/plan  protein calorie malnutrition Continue extra portion protein with lunch and dinner for now and monitor weight. Continue MVI  Diastolic chf Stable. Continue lisinopril 2.5 mg daily with metoprolol 50 mg daily, aldactone 25 mg daily along with lasix.   Constipation Iron supplement likely contributes some to it. Stable, continue senokot s   [name], MD  [facility] Adult Medicine [phone] (Monday-Friday 8 am - 5 pm) [phone] (afterhours)

## 2015-04-29 LAB — HEPATIC FUNCTION PANEL
ALK PHOS: 49 U/L (ref 25–125)
ALT: 10 U/L (ref 7–35)
AST: 12 U/L — AB (ref 13–35)
BILIRUBIN, TOTAL: 0.2 mg/dL

## 2015-04-29 LAB — BASIC METABOLIC PANEL
BUN: 14 mg/dL (ref 4–21)
CREATININE: 0.6 mg/dL (ref 0.5–1.1)
GLUCOSE: 118 mg/dL
Potassium: 3.9 mmol/L (ref 3.4–5.3)
Sodium: 144 mmol/L (ref 137–147)

## 2015-04-29 LAB — TSH: TSH: 1.94 u[IU]/mL (ref 0.41–5.90)

## 2015-04-29 LAB — LIPID PANEL
Cholesterol: 182 mg/dL (ref 0–200)
HDL: 39 mg/dL (ref 35–70)
LDL CALC: 105 mg/dL
TRIGLYCERIDES: 188 mg/dL — AB (ref 40–160)

## 2015-04-29 LAB — HEMOGLOBIN A1C: HEMOGLOBIN A1C: 5.6

## 2015-05-17 ENCOUNTER — Encounter: Payer: Self-pay | Admitting: Internal Medicine

## 2015-05-17 ENCOUNTER — Non-Acute Institutional Stay (SKILLED_NURSING_FACILITY): Payer: Medicare Other | Admitting: Internal Medicine

## 2015-05-17 DIAGNOSIS — Z794 Long term (current) use of insulin: Secondary | ICD-10-CM

## 2015-05-17 DIAGNOSIS — E1122 Type 2 diabetes mellitus with diabetic chronic kidney disease: Secondary | ICD-10-CM | POA: Diagnosis not present

## 2015-05-17 DIAGNOSIS — E261 Secondary hyperaldosteronism: Secondary | ICD-10-CM

## 2015-05-17 DIAGNOSIS — N183 Chronic kidney disease, stage 3 unspecified: Secondary | ICD-10-CM | POA: Insufficient documentation

## 2015-05-17 DIAGNOSIS — E785 Hyperlipidemia, unspecified: Secondary | ICD-10-CM | POA: Diagnosis not present

## 2015-05-17 DIAGNOSIS — D509 Iron deficiency anemia, unspecified: Secondary | ICD-10-CM

## 2015-05-17 NOTE — Progress Notes (Signed)
Patient ID: Mia Calhoun, female   DOB: 12/17/1940, 75 y.o.   MRN: [id number]     [facility]  Code Status - Full Code  Chief Complaint  Patient presents with  . Medical Management of Chronic Issues    Routine Visit   No Known Allergies  No flowsheet data found.   HPI 75 y/o patient seen for routine visit. She has been at her baseline. No new concern from staff.   ROS- limited with her aphasia  Continues to smoke. Denies any concerns  Past medical history reviewed  Medication   Medication List       This list is accurate as of: 05/17/15  3:35 PM.  Always use your most recent med list.               acetaminophen 650 MG CR tablet  Commonly known as:  TYLENOL  Take 650 mg by mouth every 6 (six) hours as needed for pain.     aspirin EC 81 MG tablet  Take 81 mg by mouth daily.     CERTAVITE/ANTIOXIDANTS Tabs  Take 1 tablet by mouth daily.     cholecalciferol 1000 units tablet  Commonly known as:  VITAMIN D  Take 2,000 Units by mouth daily.     fluticasone 50 MCG/ACT nasal spray  Commonly known as:  FLONASE  Place 2 sprays into both nostrils 2 (two) times daily.     furosemide 20 MG tablet  Commonly known as:  LASIX  Take 20 mg by mouth daily. Take 1 tablet daily for edema.     guaifenesin 100 MG/5ML syrup  Commonly known as:  ROBITUSSIN  Take 100 mg by mouth every 8 (eight) hours as needed for cough. Take 10 mL by mouth every 8 hours as needed for cough     insulin glargine 100 UNIT/ML injection  Commonly known as:  LANTUS  Inject 30 Units into the skin at bedtime.     insulin lispro 100 UNIT/ML injection  Commonly known as:  HUMALOG  Inject 8 Units into the skin 2 (two) times daily. 7 am and 4:30 pm. Sliding Scale >150 = 5 units, >250 = 8 units, > 350 units = 10 units, expires 28 days after opening     iron polysaccharides 150 MG capsule  Commonly known as:  NIFEREX  Take 150 mg by mouth 2 (two) times daily. Take  1 tablet daily for anemia.     levothyroxine 50 MCG tablet  Commonly known as:  SYNTHROID, LEVOTHROID  Take 50 mcg by mouth daily before breakfast. Take 1 tablet daily for thyroid therapy.     lisinopril 2.5 MG tablet  Commonly known as:  PRINIVIL,ZESTRIL  Take 2.5 mg by mouth daily. Take 1 tablet by mouth for HTN /renal protection.     Melatonin 3 MG Tabs  Take 3 mg by mouth at bedtime. Take 30 minutes before bedtime     metoprolol succinate 50 MG 24 hr tablet  Commonly known as:  TOPROL-XL  Take 50 mg by mouth daily. Take 1 tablet by mouth daily for HTN,  with or immediately following a meal.     polyvinyl alcohol 1.4 % ophthalmic solution  Commonly known as:  LIQUIFILM TEARS  1 drop 4 (four) times daily as needed for dry eyes. Wait 3-5 minutes between 2 eye meds     sennosides-docusate sodium 8.6-50 MG tablet  Commonly known as:  SENOKOT-S  Take 1 tablet  by mouth 2 (two) times daily. Take 1 tablet twice daily for constipation.     simvastatin 5 MG tablet  Commonly known as:  ZOCOR  Take 5 mg by mouth at bedtime. Take 1 tablet by mouth every night at bedtime for hyperlipidemia.     spironolactone 25 MG tablet  Commonly known as:  ALDACTONE  Take 25 mg by mouth daily.        Physical exam BP 100/58 mmHg  Pulse 105  Temp(Src) 98.8 F (37.1 C) (Oral)  Resp 18  Ht  (1.702 m)  Wt 189 lb 9.6 oz (86.002 kg)  BMI 29.69 kg/m2  SpO2 95%  Wt Readings from Last 3 Encounters:  05/17/15 189 lb 9.6 oz (86.002 kg)  04/21/15 189 lb 3.2 oz (85.821 kg)  03/30/15 192 lb 12.8 oz (87.454 kg)   Constitutional: elderly frail patient with expressive aphasia    Head: atraumatic, normocephalic Throat: MMM, poor dentition Nose: no sinus tenderness, no nasal discharge Cardiovascular: Normal rate, regular rhythm and normal heart sounds. Weak dp pulses   Pulmonary/Chest: Effort normal. Poor air entry. No wheeze or rhonchi or crackles. No respiratory distress.  Abdominal: Bowel  sounds are normal. She exhibits no distension and no mass.  Musculoskeletal: She exhibits no edema. Right sided hemiparesis, able to propel in wheelchair by left side  Neurological: She is alert. Can make her needs known.  kyphosis + Skin: Skin is warm and dry. She is not diaphoretic.     Labs-   CBC Latest Ref Rng 05/19/2008  WBC 4.0 - 10.5 K/uL 5.2  Hemoglobin 12.0 - 15.0 g/dL 40.9  Hematocrit 81.1 - 46.0 % 39.2  Platelets 150 - 400 K/uL 239   Lab Results  Component Value Date   HGBA1C 5.6 04/29/2015   Lipid Panel     Component Value Date/Time   CHOL 182 04/29/2015   TRIG 188* 04/29/2015   HDL 39 04/29/2015   LDLCALC 105 04/29/2015   05/29/14 k 5.1 05/26/14 wbc 10.9, hb 10.8, hct 35.6, plt 315, mcv 76.6, na 138, k 5.8, glu 76, bun 42, cr 1.73, alb 3.3, lft wnl, t.chol 115, tg 115, a1c 7.7   Assessment/plan   HLD Reviewed recent lipid panel. Continue zocor 5 mg daily   DM Lab Results  Component Value Date   HGBA1C 5.6 04/29/2015  a1c suggestive of controlled Diabetes. Continue lantus 30 u and lispro 5 u am and 8 u pm. Continue cbg check. Continue aspirin, lisinopril and statin. No microalbuminuria in lab from 12/2014.   ckd stage 3 With diabetes. Continue lisinopril for renal protection BMP Latest Ref Rng 04/29/2015 05/29/2014 05/19/2008  Glucose 70 - 99 mg/dL - - 914(N)  BUN 4 - 21 mg/dL 14 - 82(N)  Creatinine 0.5 - 1.1 mg/dL 0.6 - 5.62(Z)  Sodium 308 - 147 mmol/L 144 - 138  Potassium 3.4 - 5.3 mmol/L 3.9 5.1 4.1  Chloride 96 - 112 mEq/L - - 104  CO2 19 - 32 mEq/L - - 28  Calcium 8.4 - 10.5 mg/dL - - 8.9    Secondary hyperaldosteronism Continue lasix and aldactone. Has hx of chf with edema.  Iron def anemia Continue niferex for now. Normal iron stores on review   [name], MD  [facility] [phone] (Monday-Friday 8 am - 5 pm) [phone] (afterhours)

## 2015-06-17 ENCOUNTER — Encounter: Payer: Self-pay | Admitting: Internal Medicine

## 2015-06-17 ENCOUNTER — Non-Acute Institutional Stay (SKILLED_NURSING_FACILITY): Payer: Medicare Other | Admitting: Internal Medicine

## 2015-06-17 DIAGNOSIS — I5032 Chronic diastolic (congestive) heart failure: Secondary | ICD-10-CM | POA: Diagnosis not present

## 2015-06-17 DIAGNOSIS — K5900 Constipation, unspecified: Secondary | ICD-10-CM | POA: Diagnosis not present

## 2015-06-17 DIAGNOSIS — E261 Secondary hyperaldosteronism: Secondary | ICD-10-CM

## 2015-06-17 DIAGNOSIS — K5909 Other constipation: Secondary | ICD-10-CM

## 2015-06-17 NOTE — Progress Notes (Signed)
Patient ID: Mia Calhoun, female   DOB: [birth year], 75 y.o.   MRN: [id number]     [facility]  Code Status - Full Code  Chief Complaint  Patient presents with  . Medical Management of Chronic Issues    Routine Visit   No Known Allergies   HPI 75 y/o patient seen for routine visit. She has been at her baseline. No new concern from patient or staff.   ROS- limited with her aphasia  Continues to smoke. Denies any concerns  Past medical history reviewed  Medication   Medication List       This list is accurate as of: 06/17/15 11:52 AM.  Always use your most recent med list.               acetaminophen 650 MG CR tablet  Commonly known as:  TYLENOL  Take 650 mg by mouth every 6 (six) hours as needed for pain.     aspirin EC 81 MG tablet  Take 81 mg by mouth daily.     CERTAVITE/ANTIOXIDANTS Tabs  Take 1 tablet by mouth daily.     cholecalciferol 1000 units tablet  Commonly known as:  VITAMIN D  Take 2,000 Units by mouth daily.     fluticasone 50 MCG/ACT nasal spray  Commonly known as:  FLONASE  Place 2 sprays into both nostrils 2 (two) times daily.     furosemide 20 MG tablet  Commonly known as:  LASIX  Take 20 mg by mouth daily. Take 1 tablet daily for edema.     guaifenesin 100 MG/5ML syrup  Commonly known as:  ROBITUSSIN  Take 100 mg by mouth every 8 (eight) hours as needed for cough. Take 10 mL by mouth every 8 hours as needed for cough     insulin glargine 100 UNIT/ML injection  Commonly known as:  LANTUS  Inject 30 Units into the skin at bedtime.     iron polysaccharides 150 MG capsule  Commonly known as:  NIFEREX  Take 150 mg by mouth 2 (two) times daily. Take 1 tablet daily for anemia.     levothyroxine 50 MCG tablet  Commonly known as:  SYNTHROID, LEVOTHROID  Take 50 mcg by mouth daily before breakfast. Take 1 tablet daily for thyroid therapy.     lisinopril 2.5 MG tablet  Commonly known as:   PRINIVIL,ZESTRIL  Take 2.5 mg by mouth daily. Take 1 tablet by mouth for HTN /renal protection.     Melatonin 3 MG Tabs  Take 3 mg by mouth at bedtime. Take 30 minutes before bedtime     metoprolol succinate 50 MG 24 hr tablet  Commonly known as:  TOPROL-XL  Take 50 mg by mouth daily. Take 1 tablet by mouth daily for HTN,  with or immediately following a meal.     polyvinyl alcohol 1.4 % ophthalmic solution  Commonly known as:  LIQUIFILM TEARS  1 drop 4 (four) times daily as needed for dry eyes. Wait 3-5 minutes between 2 eye meds     sennosides-docusate sodium 8.6-50 MG tablet  Commonly known as:  SENOKOT-S  Take 1 tablet by mouth 2 (two) times daily. Take 1 tablet twice daily for constipation.     simvastatin 5 MG tablet  Commonly known as:  ZOCOR  Take 5 mg by mouth at bedtime. Take 1 tablet by mouth every night at bedtime for hyperlipidemia.     spironolactone 25 MG tablet  Commonly  known as:  ALDACTONE  Take 25 mg by mouth daily.        Physical exam BP 108/74 mmHg  Pulse 97  Temp(Src) 97.9 F (36.6 C) (Oral)  Resp 20  Ht 5\' 7"  (1.702 m)  Wt 190 lb 6.4 oz (86.365 kg)  BMI 29.81 kg/m2  SpO2 97%  Wt Readings from Last 3 Encounters:  06/17/15 190 lb 6.4 oz (86.365 kg)  05/17/15 189 lb 9.6 oz (86.002 kg)  04/21/15 189 lb 3.2 oz (85.821 kg)   Constitutional: elderly frail patient with expressive aphasia    Head: atraumatic, normocephalic Throat: MMM, poor dentition Nose: no sinus tenderness, no nasal discharge Cardiovascular: Normal rate, regular rhythm and normal heart sounds. Weak dp pulses   Pulmonary/Chest: CTAB Abdominal: Bowel sounds are normal.  Musculoskeletal: She exhibits no edema. Right sided hemiparesis, able to propel in wheelchair by left side  Neurological: She is alert. Can make her needs known.  kyphosis + Skin: Skin is warm and dry. She is not diaphoretic.     Labs-   CBC Latest Ref Rng 05/19/2008  WBC 4.0 - 10.5 K/uL 5.2  Hemoglobin 12.0 -  15.0 g/dL 40.912.7  Hematocrit 81.136.0 - 46.0 % 39.2  Platelets 150 - 400 K/uL 239   Lab Results  Component Value Date   HGBA1C 5.6 04/29/2015   Lipid Panel     Component Value Date/Time   CHOL 182 04/29/2015   TRIG 188* 04/29/2015   HDL 39 04/29/2015   LDLCALC 105 04/29/2015   05/29/14 k 5.1 05/26/14 wbc 10.9, hb 10.8, hct 35.6, plt 315, mcv 76.6, na 138, k 5.8, glu 76, bun 42, cr 1.73, alb 3.3, lft wnl, t.chol 115, tg 115, a1c 7.7   Assessment/plan  Secondary hyperaldosteronism Continue lasix and aldactone.   CHF Stable, continue lasix, toprol xl and aldactone for now  Constipation Stable, continue senokot s  Oneal GroutMAHIMA Kabe Mckoy, MD  [facility] [phone] (Monday-Friday 8 am - 5 pm) [phone] (afterhours)

## 2015-07-21 ENCOUNTER — Non-Acute Institutional Stay (SKILLED_NURSING_FACILITY): Payer: Medicare Other | Admitting: Internal Medicine

## 2015-07-21 ENCOUNTER — Encounter: Payer: Self-pay | Admitting: Internal Medicine

## 2015-07-21 DIAGNOSIS — I10 Essential (primary) hypertension: Secondary | ICD-10-CM

## 2015-07-21 DIAGNOSIS — M818 Other osteoporosis without current pathological fracture: Secondary | ICD-10-CM | POA: Diagnosis not present

## 2015-07-21 DIAGNOSIS — D509 Iron deficiency anemia, unspecified: Secondary | ICD-10-CM

## 2015-07-21 DIAGNOSIS — E785 Hyperlipidemia, unspecified: Secondary | ICD-10-CM | POA: Diagnosis not present

## 2015-07-21 DIAGNOSIS — M810 Age-related osteoporosis without current pathological fracture: Secondary | ICD-10-CM

## 2015-07-21 NOTE — Progress Notes (Signed)
Patient ID: Mia Calhoun, female   DOB: 1940-03-15, 75 y.o.   MRN: [id number]     [facility]  Code Status - Full Code  Chief Complaint  Patient presents with  . Medical Management of Chronic Issues    Routine Visit   No Known Allergies   HPI 75 y/o patient is seen for routine visit. She has been at her baseline. Denies any concerns. No new concern from patient or staff.   ROS- limited with her aphasia   Past medical history reviewed  Medication   Medication List       This list is accurate as of: 07/21/15  3:52 PM.  Always use your most recent med list.               acetaminophen 650 MG CR tablet  Commonly known as:  TYLENOL  Take 650 mg by mouth every 6 (six) hours as needed for pain.     aspirin EC 81 MG tablet  Take 81 mg by mouth daily.     CERTAVITE/ANTIOXIDANTS Tabs  Take 1 tablet by mouth daily.     cholecalciferol 1000 units tablet  Commonly known as:  VITAMIN D  Take 2,000 Units by mouth daily.     fluticasone 50 MCG/ACT nasal spray  Commonly known as:  FLONASE  Place 2 sprays into both nostrils 2 (two) times daily.     furosemide 20 MG tablet  Commonly known as:  LASIX  Take 20 mg by mouth daily. Take 1 tablet daily for edema.     guaifenesin 100 MG/5ML syrup  Commonly known as:  ROBITUSSIN  Take 100 mg by mouth every 8 (eight) hours as needed for cough. Take 10 mL by mouth every 8 hours as needed for cough     insulin glargine 100 UNIT/ML injection  Commonly known as:  LANTUS  Inject 30 Units into the skin at bedtime.     iron polysaccharides 150 MG capsule  Commonly known as:  NIFEREX  Take 150 mg by mouth 2 (two) times daily. Take 1 tablet daily for anemia.     levothyroxine 50 MCG tablet  Commonly known as:  SYNTHROID, LEVOTHROID  Take 50 mcg by mouth daily before breakfast. Take 1 tablet daily for thyroid therapy.     lisinopril 2.5 MG tablet  Commonly known as:  PRINIVIL,ZESTRIL  Take 2.5 mg  by mouth daily. Take 1 tablet by mouth for HTN /renal protection.     Melatonin 3 MG Tabs  Take 3 mg by mouth at bedtime. Take 30 minutes before bedtime     metoprolol succinate 50 MG 24 hr tablet  Commonly known as:  TOPROL-XL  Take 50 mg by mouth daily. Take 1 tablet by mouth daily for HTN,  with or immediately following a meal.     polyvinyl alcohol 1.4 % ophthalmic solution  Commonly known as:  LIQUIFILM TEARS  1 drop 4 (four) times daily as needed for dry eyes. Wait 3-5 minutes between 2 eye meds     sennosides-docusate sodium 8.6-50 MG tablet  Commonly known as:  SENOKOT-S  Take 1 tablet by mouth 2 (two) times daily. Take 1 tablet twice daily for constipation.     simvastatin 5 MG tablet  Commonly known as:  ZOCOR  Take 5 mg by mouth at bedtime. Take 1 tablet by mouth every night at bedtime for hyperlipidemia.     spironolactone 25 MG tablet  Commonly known  as:  ALDACTONE  Take 25 mg by mouth daily.        Physical exam BP 128/82 mmHg  Pulse 70  Temp(Src) 98.2 F (36.8 C) (Oral)  Resp 22  Ht 5\' 7"  (1.702 m)  Wt 180 lb 12.8 oz (82.01 kg)  BMI 28.31 kg/m2  SpO2 97%  Wt Readings from Last 3 Encounters:  07/21/15 180 lb 12.8 oz (82.01 kg)  06/17/15 190 lb 6.4 oz (86.365 kg)  05/17/15 189 lb 9.6 oz (86.002 kg)   Constitutional: 75 patient with expressive aphasia    Head: atraumatic, normocephalic Throat: MMM, poor dentition Nose: no sinus tenderness, no nasal discharge Cardiovascular: Normal rate, regular rhythm and normal heart sounds. Weak dp pulses   Pulmonary/Chest: CTAB Abdominal: Bowel sounds are normal.  Musculoskeletal: She exhibits no edema. Right sided hemiparesis, able to propel in wheelchair by left side  Neurological: She is alert. Can make her needs known.  kyphosis + Skin: Skin is warm and dry. She is not diaphoretic.     Labs-   CBC Latest Ref Rng 05/19/2008  WBC 4.0 - 10.5 K/uL 5.2  Hemoglobin 12.0 - 15.0 g/dL 96.212.7  Hematocrit 95.236.0 -  46.0 % 39.2  Platelets 150 - 400 K/uL 239   Lab Results  Component Value Date   HGBA1C 5.6 04/29/2015   Lipid Panel     Component Value Date/Time   CHOL 182 04/29/2015   TRIG 188* 04/29/2015   HDL 39 04/29/2015   LDLCALC 105 04/29/2015   05/29/14 k 5.1 05/26/14 wbc 10.9, hb 10.8, hct 35.6, plt 315, mcv 76.6, na 138, k 5.8, glu 76, bun 42, cr 1.73, alb 3.3, lft wnl, t.chol 115, tg 115, a1c 7.7   Assessment/plan  Hyperlipidemia Continue zocor 5 mg daily and monitor  Age related osteoporosis Worsened with her smoking. Continue vitamin d supplement  HTN Stable, continue toprol xl, lisinopril, aldactone and lasix. Monitor bp reading. With stable reading, attempt reduction of aldactone to 12.5 mg daily and monitor. If bp and breathing status remain stabke, consider discontinuing aldactone.   Anemia Currently on iron supplement, no recent cbc for review, check cbc   Oneal GroutMAHIMA Riona Lahti, MD  [facility] [phone] (Monday-Friday 8 am - 5 pm) [phone] (afterhours)

## 2015-08-12 ENCOUNTER — Non-Acute Institutional Stay (SKILLED_NURSING_FACILITY): Payer: Medicare Other | Admitting: Internal Medicine

## 2015-08-12 ENCOUNTER — Encounter: Payer: Self-pay | Admitting: Internal Medicine

## 2015-08-12 DIAGNOSIS — E042 Nontoxic multinodular goiter: Secondary | ICD-10-CM

## 2015-08-12 DIAGNOSIS — K5900 Constipation, unspecified: Secondary | ICD-10-CM | POA: Diagnosis not present

## 2015-08-12 DIAGNOSIS — I69359 Hemiplegia and hemiparesis following cerebral infarction affecting unspecified side: Secondary | ICD-10-CM

## 2015-08-12 DIAGNOSIS — K5909 Other constipation: Secondary | ICD-10-CM | POA: Insufficient documentation

## 2015-08-12 NOTE — Progress Notes (Signed)
Patient ID: Mia Calhoun, female   DOB: Aug 24, 1940, 75 y.o.   MRN: [id number]      [facility]  Code Status - Full Code  Chief Complaint  Patient presents with  . Medical Management of Chronic Issues   No Known Allergies  Advanced Directives 08/12/2015  Does patient have an advance directive? No  Would patient like information on creating an advanced directive? No - patient declined information     HPI 75 y/o patient is seen for routine visit. Denies any concerns. No new concern from patient or staff.   ROS- limited with her aphasia   Past Medical History  Diagnosis Date  . Unspecified vitamin D deficiency   . 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 Congestive heart failure, unspecified   . Unspecified constipation   . Unspecified hypothyroidism   . 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 Type I (juvenile type) diabetes mellitus with neurological manifestations, uncontrolled   . Edema   . Hemiplegia, unspecified, affecting unspecified side   . Osteoporosis, unspecified   . Unspecified disorder of kidney and ureter   . Unspecified hereditary and idiopathic peripheral neuropathy   . Depressive disorder, not elsewhere classified   . Unspecified essential hypertension   . Unspecified late effects of cerebrovascular disease   . Anemia, unspecified   . Nontoxic multinodular goiter   . CKD (chronic kidney disease) stage 3, GFR 30-59 ml/min      Medication   Medication List       This list is accurate as of: 08/12/15  4:41 PM.  Always use your most recent med list.               acetaminophen 650 MG CR tablet  Commonly known as:  TYLENOL  Take 650 mg by mouth every 6 (six) hours as needed for pain.     aspirin EC 81 MG tablet  Take 81 mg by mouth daily.     CERTAVITE/ANTIOXIDANTS Tabs  Take 1 tablet by mouth daily.     cholecalciferol 1000 units tablet  Commonly known as:  VITAMIN D    Take 2,000 Units by mouth daily.     fluticasone 50 MCG/ACT nasal spray  Commonly known as:  FLONASE  Place 2 sprays into both nostrils 2 (two) times daily.     furosemide 20 MG tablet  Commonly known as:  LASIX  Take 20 mg by mouth daily. Take 1 tablet daily for edema.     guaifenesin 100 MG/5ML syrup  Commonly known as:  ROBITUSSIN  Take 100 mg by mouth every 8 (eight) hours as needed for cough. Take 10 mL by mouth every 8 hours as needed for cough     insulin glargine 100 UNIT/ML injection  Commonly known as:  LANTUS  Inject 30 Units into the skin at bedtime.     iron polysaccharides 150 MG capsule  Commonly known as:  NIFEREX  Take 150 mg by mouth 2 (two) times daily. Take 1 tablet daily for anemia.     levothyroxine 50 MCG tablet  Commonly known as:  SYNTHROID, LEVOTHROID  Take 50 mcg by mouth daily before breakfast. Take 1 tablet daily for thyroid therapy.     lisinopril 2.5 MG tablet  Commonly known as:  PRINIVIL,ZESTRIL  Take 2.5 mg by mouth daily. Take 1 tablet by mouth for HTN /renal protection.     Melatonin 3 MG Tabs  Take 3 mg  by mouth at bedtime. Take 30 minutes before bedtime     metoprolol succinate 50 MG 24 hr tablet  Commonly known as:  TOPROL-XL  Take 50 mg by mouth daily. Take 1 tablet by mouth daily for HTN,  with or immediately following a meal.     polyvinyl alcohol 1.4 % ophthalmic solution  Commonly known as:  LIQUIFILM TEARS  1 drop 4 (four) times daily as needed for dry eyes. Wait 3-5 minutes between 2 eye meds     sennosides-docusate sodium 8.6-50 MG tablet  Commonly known as:  SENOKOT-S  Take 1 tablet by mouth 2 (two) times daily. Take 1 tablet twice daily for constipation.     simvastatin 5 MG tablet  Commonly known as:  ZOCOR  Take 5 mg by mouth at bedtime. Take 1 tablet by mouth every night at bedtime for hyperlipidemia.     spironolactone 25 MG tablet  Commonly known as:  ALDACTONE  Take 12.5 mg by mouth daily. For HTN, CHF         Physical exam BP 122/64 mmHg  Pulse 74  Temp(Src) 97.2 F (36.2 C) (Oral)  Resp 22  Ht 5\' 7"  (1.702 m)  Wt 180 lb 12.8 oz (82.01 kg)  BMI 28.31 kg/m2  Wt Readings from Last 3 Encounters:  08/12/15 180 lb 12.8 oz (82.01 kg)  07/21/15 180 lb 12.8 oz (82.01 kg)  06/17/15 190 lb 6.4 oz (86.365 kg)   Constitutional: elderly patient with expressive aphasia    Head: atraumatic, normocephalic Throat: MMM, poor dentition Nose: no sinus tenderness, no nasal discharge Cardiovascular: Normal rate, regular rhythm and normal heart sounds. Weak dp pulses   Pulmonary/Chest: CTAB Abdominal: Bowel sounds are normal.  Musculoskeletal: no leg edema. right sided hemiparesis, able to propel in wheelchair by left side  Neurological: Alert. Can make her needs known. kyphosis + Skin: warm and dry     Labs-   CBC Latest Ref Rng 05/19/2008  WBC 4.0 - 10.5 K/uL 5.2  Hemoglobin 12.0 - 15.0 g/dL 16.112.7  Hematocrit 09.636.0 - 46.0 % 39.2  Platelets 150 - 400 K/uL 239   Lab Results  Component Value Date   HGBA1C 5.6 04/29/2015   Lipid Panel     Component Value Date/Time   CHOL 182 04/29/2015   TRIG 188* 04/29/2015   HDL 39 04/29/2015   LDLCALC 105 04/29/2015   05/26/14 wbc 10.9, hb 10.8, hct 35.6, plt 315, mcv 76.6, na 138, k 5.8, glu 76, bun 42, cr 1.73, alb 3.3, lft wnl, t.chol 115, tg 115, a1c 7.7   Assessment/plan  Non toxic multinodular goitre Continue levothyroxine 50 mcg daily and monitor tsh  Flaccid hemiplegia right side Post CVA, continue baby aspirin and statin. Continue to monitor for bowel movement  Constipation Stable, continue senna s    Oneal GroutMAHIMA Uri Turnbough, MD  [facility] [phone] (Monday-Friday 8 am - 5 pm) [phone] (afterhours)

## 2015-09-09 ENCOUNTER — Non-Acute Institutional Stay (SKILLED_NURSING_FACILITY): Payer: Medicare Other | Admitting: Internal Medicine

## 2015-09-09 ENCOUNTER — Encounter: Payer: Self-pay | Admitting: Internal Medicine

## 2015-09-09 DIAGNOSIS — F17200 Nicotine dependence, unspecified, uncomplicated: Secondary | ICD-10-CM | POA: Diagnosis not present

## 2015-09-09 DIAGNOSIS — I5032 Chronic diastolic (congestive) heart failure: Secondary | ICD-10-CM | POA: Diagnosis not present

## 2015-09-09 DIAGNOSIS — Z9111 Patient's noncompliance with dietary regimen: Secondary | ICD-10-CM | POA: Diagnosis not present

## 2015-09-09 DIAGNOSIS — I10 Essential (primary) hypertension: Secondary | ICD-10-CM

## 2015-09-09 DIAGNOSIS — J439 Emphysema, unspecified: Secondary | ICD-10-CM

## 2015-09-09 DIAGNOSIS — Z91199 Patient's noncompliance with other medical treatment and regimen due to unspecified reason: Secondary | ICD-10-CM

## 2015-09-09 NOTE — Progress Notes (Signed)
This encounter was created in error - please disregard.

## 2015-09-09 NOTE — Progress Notes (Signed)
Patient ID: Mia Calhoun, female   DOB: 06/27/1940, 75 y.o.   MRN: [id number]      [facility]  Code Status - Full Code  Chief Complaint  Patient presents with  . Medical Management of Chronic Issues    Routine Visit   No Known Allergies  Advanced Directives 08/12/2015  Does patient have an advance directive? No  Would patient like information on creating an advanced directive? No - patient declined information     HPI 75 y/o patient is seen for routine visit. Denies any concerns. No new concern from patient. She is non complaint with her diet.   ROS- limited with her aphasia   Past Medical History  Diagnosis Date  . Unspecified vitamin D deficiency   . 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 Congestive heart failure, unspecified   . Unspecified constipation   . Unspecified hypothyroidism   . 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 Type I (juvenile type) diabetes mellitus with neurological manifestations, uncontrolled   . Edema   . Hemiplegia, unspecified, affecting unspecified side   . Osteoporosis, unspecified   . Unspecified disorder of kidney and ureter   . Unspecified hereditary and idiopathic peripheral neuropathy   . Depressive disorder, not elsewhere classified   . Unspecified essential hypertension   . Unspecified late effects of cerebrovascular disease   . Anemia, unspecified   . Nontoxic multinodular goiter   . CKD (chronic kidney disease) stage 3, GFR 30-59 ml/min      Medication   Medication List       This list is accurate as of: 09/09/15  2:47 PM.  Always use your most recent med list.               acetaminophen 650 MG CR tablet  Commonly known as:  TYLENOL  Take 650 mg by mouth every 6 (six) hours as needed for pain.     aspirin EC 81 MG tablet  Take 81 mg by mouth daily.     CERTAVITE/ANTIOXIDANTS Tabs  Take 1 tablet by mouth daily.     cholecalciferol 1000  units tablet  Commonly known as:  VITAMIN D  Take 2,000 Units by mouth daily.     fluticasone 50 MCG/ACT nasal spray  Commonly known as:  FLONASE  Place 2 sprays into both nostrils 2 (two) times daily.     furosemide 20 MG tablet  Commonly known as:  LASIX  Take 20 mg by mouth daily. Take 1 tablet daily for edema.     guaifenesin 100 MG/5ML syrup  Commonly known as:  ROBITUSSIN  Take 100 mg by mouth every 8 (eight) hours as needed for cough. Take 10 mL by mouth every 8 hours as needed for cough     insulin glargine 100 UNIT/ML injection  Commonly known as:  LANTUS  Inject 30 Units into the skin at bedtime.     insulin lispro 100 UNIT/ML injection  Commonly known as:  HUMALOG  Inject 8 Units into the skin every morning.     iron polysaccharides 150 MG capsule  Commonly known as:  NIFEREX  Take 150 mg by mouth 2 (two) times daily. Take 1 tablet daily for anemia.     levothyroxine 50 MCG tablet  Commonly known as:  SYNTHROID, LEVOTHROID  Take 50 mcg by mouth daily before breakfast. Take 1 tablet daily for thyroid therapy.     lisinopril 2.5 MG tablet  Commonly known as:  PRINIVIL,ZESTRIL  Take 2.5 mg by mouth daily. Take 1 tablet by mouth for HTN /renal protection.     Melatonin 3 MG Tabs  Take 3 mg by mouth at bedtime. Take 30 minutes before bedtime     metoprolol succinate 50 MG 24 hr tablet  Commonly known as:  TOPROL-XL  Take 50 mg by mouth daily. Take 1 tablet by mouth daily for HTN,  with or immediately following a meal.     polyvinyl alcohol 1.4 % ophthalmic solution  Commonly known as:  LIQUIFILM TEARS  1 drop 4 (four) times daily as needed for dry eyes. Wait 3-5 minutes between 2 eye meds     sennosides-docusate sodium 8.6-50 MG tablet  Commonly known as:  SENOKOT-S  Take 1 tablet by mouth 2 (two) times daily. Take 1 tablet twice daily for constipation.     simvastatin 5 MG tablet  Commonly known as:  ZOCOR  Take 5 mg by mouth at bedtime. Take 1 tablet by  mouth every night at bedtime for hyperlipidemia.     spironolactone 25 MG tablet  Commonly known as:  ALDACTONE  Take 12.5 mg by mouth daily. For HTN, CHF        Physical exam BP 132/70 mmHg  Pulse 67  Temp(Src) 98.3 F (36.8 C) (Oral)  Resp 22  Ht 5\' 7"  (1.702 m)  Wt 180 lb 12.8 oz (82.01 kg)  BMI 28.31 kg/m2  SpO2 99%  Wt Readings from Last 3 Encounters:  09/09/15 180 lb 12.8 oz (82.01 kg)  08/12/15 180 lb 12.8 oz (82.01 kg)  07/21/15 180 lb 12.8 oz (82.01 kg)   Constitutional: elderly patient, overweight with expressive aphasia    Head: atraumatic, normocephalic Throat: MMM, poor dentition Nose: no sinus tenderness, no nasal discharge Cardiovascular: Normal rate, regular rhythm and normal heart sounds. Weak dp pulses   Pulmonary/Chest: CTAB Abdominal: Bowel sounds are normal.  Musculoskeletal: no leg edema. right sided hemiparesis, able to propel in wheelchair by left side  Neurological: Alert. Can make her needs known. kyphosis + Skin: warm and dry     Labs-   CBC Latest Ref Rng 05/19/2008  WBC 4.0 - 10.5 K/uL 5.2  Hemoglobin 12.0 - 15.0 g/dL 16.1  Hematocrit 09.6 - 46.0 % 39.2  Platelets 150 - 400 K/uL 239   Lab Results  Component Value Date   HGBA1C 5.6 04/29/2015   Lipid Panel     Component Value Date/Time   CHOL 182 04/29/2015   TRIG 188* 04/29/2015   HDL 39 04/29/2015   LDLCALC 105 04/29/2015   05/26/14 wbc 10.9, hb 10.8, hct 35.6, plt 315, mcv 76.6, na 138, k 5.8, glu 76, bun 42, cr 1.73, alb 3.3, lft wnl, t.chol 115, tg 115, a1c 7.7   Assessment/plan  Chronic CHF Stable. Continue metoprolol 50 mg daily, lasix 20 mg daily, spironolactone 12.5 mg daily and lisinopril 2.5 mg daily  HTN Stable monitor bp and continue her b blocker with ACEI   Emphysema With her continuing to smoke, this will likely worsen. Breathing currently stable, monitor  Tobacco use Increasing risk for another CVA and CAD. Monitor clinically. Continue skin care. Monitor  her breathing status  noncompliance with treatment plan Family aware of this and would like to keep patient full code and comply with her wishes of refusing care. Aware about risk associated with this     [name], MD  [facility] [phone] (Monday-Friday 8 am - 5 pm)  [phone] (afterhours)

## 2015-10-05 ENCOUNTER — Other Ambulatory Visit: Payer: Self-pay

## 2015-10-05 DIAGNOSIS — F411 Generalized anxiety disorder: Secondary | ICD-10-CM

## 2015-10-05 MED ORDER — LORAZEPAM 0.5 MG PO TABS: ORAL_TABLET | ORAL | 5 refills | Status: DC

## 2015-10-06 LAB — CBC AND DIFFERENTIAL
HCT: 38 % (ref 36–46)
Hemoglobin: 11.6 g/dL — AB (ref 12.0–16.0)
PLATELETS: 292 10*3/uL (ref 150–399)
WBC: 8.1 10*3/mL

## 2015-10-06 LAB — BASIC METABOLIC PANEL
BUN: 60 mg/dL — AB (ref 4–21)
CREATININE: 1.8 mg/dL — AB (ref 0.5–1.1)
GLUCOSE: 453 mg/dL
POTASSIUM: 5.2 mmol/L (ref 3.4–5.3)
Sodium: 130 mmol/L — AB (ref 137–147)

## 2015-10-06 LAB — MICROALBUMIN, URINE: MICROALB UR: 29.4

## 2015-10-06 LAB — HEPATIC FUNCTION PANEL
ALT: 11 U/L (ref 7–35)
AST: 13 U/L (ref 13–35)
Alkaline Phosphatase: 100 U/L (ref 25–125)
Bilirubin, Total: 0.4 mg/dL

## 2015-10-06 LAB — HEMOGLOBIN A1C: HEMOGLOBIN A1C: 14.5

## 2015-10-20 ENCOUNTER — Encounter: Payer: Self-pay | Admitting: Internal Medicine

## 2015-10-20 ENCOUNTER — Non-Acute Institutional Stay: Payer: Medicare Other | Admitting: Internal Medicine

## 2015-10-20 DIAGNOSIS — Z794 Long term (current) use of insulin: Secondary | ICD-10-CM | POA: Diagnosis not present

## 2015-10-20 DIAGNOSIS — E1165 Type 2 diabetes mellitus with hyperglycemia: Secondary | ICD-10-CM | POA: Diagnosis not present

## 2015-10-20 DIAGNOSIS — E038 Other specified hypothyroidism: Secondary | ICD-10-CM

## 2015-10-20 DIAGNOSIS — E785 Hyperlipidemia, unspecified: Secondary | ICD-10-CM | POA: Diagnosis not present

## 2015-10-20 DIAGNOSIS — N390 Urinary tract infection, site not specified: Secondary | ICD-10-CM

## 2015-10-20 DIAGNOSIS — E1122 Type 2 diabetes mellitus with diabetic chronic kidney disease: Secondary | ICD-10-CM | POA: Diagnosis not present

## 2015-10-20 DIAGNOSIS — N183 Chronic kidney disease, stage 3 unspecified: Secondary | ICD-10-CM

## 2015-10-20 DIAGNOSIS — IMO0002 Reserved for concepts with insufficient information to code with codable children: Secondary | ICD-10-CM

## 2015-10-20 NOTE — Progress Notes (Signed)
Patient ID: Mia Calhoun, female   DOB: 02/06/1941, 75 y.o.   MRN: [id number]      [facility]  Code Status - Full Code  Chief Complaint  Patient presents with  . Medical Management of Chronic Issues    Routine Visit   No Known Allergies  Advanced Directives 08/12/2015  Does patient have an advance directive? No  Would patient like information on creating an advanced directive? No - patient declined information     HPI 75 y/o patient is seen for routine visit. Denies any concerns. Limited HPI and ROS. She has had her insulin adjusted with cbg running high. She just completed antibiotic rx for UTI.   ROS- limited with her aphasia   Past Medical History:  Diagnosis Date  . Anemia, unspecified   . 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 CKD (chronic kidney disease) stage 3, GFR 30-59 ml/min   . Congestive heart failure, unspecified   . Depressive disorder, not elsewhere classified   . Edema   . Hemiplegia, unspecified, affecting unspecified side   . 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 Nontoxic multinodular goiter   . Osteoporosis, unspecified   . Type I (juvenile type) diabetes mellitus with neurological manifestations, uncontrolled   . Unspecified constipation   . Unspecified disorder of kidney and ureter   . Unspecified essential hypertension   . Unspecified hereditary and idiopathic peripheral neuropathy   . Unspecified hypothyroidism   . Unspecified late effects of cerebrovascular disease   . Unspecified vitamin D deficiency      Medication   Medication List       Accurate as of 10/20/15  2:43 PM. Always use your most recent med list.          acetaminophen 650 MG CR tablet Commonly known as:  TYLENOL Take 650 mg by mouth every 6 (six) hours as needed for pain.   aspirin EC 81 MG tablet Take 81 mg by mouth daily.   CERTAVITE/ANTIOXIDANTS Tabs Take 1 tablet by mouth daily.   cholecalciferol 1000 units tablet Commonly known as:  VITAMIN D Take 2,000 Units by mouth daily.   fluticasone 50 MCG/ACT nasal spray Commonly known as:  FLONASE Place 2 sprays into both nostrils 2 (two) times daily.   furosemide 20 MG tablet Commonly known as:  LASIX Take 20 mg by mouth daily. Take 1 tablet daily for edema.   guaifenesin 100 MG/5ML syrup Commonly known as:  ROBITUSSIN Take 100 mg by mouth every 8 (eight) hours as needed for cough. Take 10 mL by mouth every 8 hours as needed for cough   insulin glargine 100 UNIT/ML injection Commonly known as:  LANTUS Inject 35 Units into the skin at bedtime.   insulin lispro 100 UNIT/ML injection Commonly known as:  HUMALOG Inject 10 Units into the skin 3 (three) times daily before meals.   iron polysaccharides 150 MG capsule Commonly known as:  NIFEREX Take 150 mg by mouth 2 (two) times daily. Take 1 tablet daily for anemia.   levothyroxine 50 MCG tablet Commonly known as:  SYNTHROID, LEVOTHROID Take 50 mcg by mouth daily before breakfast. Take 1 tablet daily for thyroid therapy.   lisinopril 2.5 MG tablet Commonly known as:  PRINIVIL,ZESTRIL Take 2.5 mg by mouth daily. Take 1 tablet by mouth for HTN /renal protection.   LORazepam 0.5 MG tablet Commonly known as:  ATIVAN Take 1 tablet by mouth 30 minutes prior to  lab draw.   Melatonin 3 MG Tabs Take 3 mg by mouth at bedtime. Take 30 minutes before bedtime   metoprolol succinate 50 MG 24 hr tablet Commonly known as:  TOPROL-XL Take 50 mg by mouth daily. Take 1 tablet by mouth daily for HTN,  with or immediately following a meal.   polyvinyl alcohol 1.4 % ophthalmic solution Commonly known as:  LIQUIFILM TEARS 1 drop 4 (four) times daily as needed for dry eyes. Wait 3-5 minutes between 2 eye meds   saccharomyces boulardii 250 MG capsule Commonly known as:  FLORASTOR Take 250 mg by mouth 2 (two) times daily. Stop date 10/27/15   sennosides-docusate sodium 8.6-50  MG tablet Commonly known as:  SENOKOT-S Take 1 tablet by mouth 2 (two) times daily. Take 1 tablet twice daily for constipation.   simvastatin 5 MG tablet Commonly known as:  ZOCOR Take 5 mg by mouth at bedtime. Take 1 tablet by mouth every night at bedtime for hyperlipidemia.   spironolactone 25 MG tablet Commonly known as:  ALDACTONE Take 12.5 mg by mouth daily. For HTN, CHF       Physical exam BP (!) 110/56   Pulse 72   Temp 97.5 F (36.4 C) (Oral)   Resp 20   Ht 5\' 7"  (1.702 m)   Wt 178 lb 9.6 oz (81 kg)   SpO2 98%   BMI 27.97 kg/m   Wt Readings from Last 3 Encounters:  10/20/15 178 lb 9.6 oz (81 kg)  09/09/15 180 lb 12.8 oz (82 kg)  08/12/15 180 lb 12.8 oz (82 kg)   Constitutional: elderly patient, overweight with expressive aphasia    Head: atraumatic, normocephalic Throat: MMM, poor dentition Nose: no sinus tenderness, no nasal discharge Cardiovascular: Normal rate, regular rhythm and normal heart sounds. Weak dp pulses   Pulmonary/Chest: CTAB Abdominal: Bowel sounds are normal.  Musculoskeletal: no leg edema. right sided hemiparesis, able to propel in wheelchair by left side  Neurological: Alert. Can make her needs known. kyphosis + Skin: warm and dry     Labs-   CBC Latest Ref Rng & Units 10/06/2015 05/19/2008  WBC 10:3/mL 8.1 5.2  Hemoglobin 12.0 - 16.0 g/dL 11.6(A) 12.7  Hematocrit 36 - 46 % 38 39.2  Platelets 150 - 399 K/L 292 239   Lab Results  Component Value Date   HGBA1C 14.5 10/06/2015   Lipid Panel     Component Value Date/Time   CHOL 182 04/29/2015   TRIG 188 (A) 04/29/2015   HDL 39 04/29/2015   LDLCALC 105 04/29/2015   Lab Results  Component Value Date   TSH 1.94 04/29/2015    05/26/14 wbc 10.9, hb 10.8, hct 35.6, plt 315, mcv 76.6, na 138, k 5.8, glu 76, bun 42, cr 1.73, alb 3.3, lft wnl, t.chol 115, tg 115, a1c 7.7   Assessment/plan  Uncontrolled DM  a1c of > 14. Her lantus has been increased to 35 u daily and to give 10 u  humalog with meals. Dietary complaince reinforced and to be followed by RD. Continue aspirin and ACEI  UTI Completed her ceftin course, encourage hydration and to provide perineal hygiene. She is at high risk for recurrent uti with uncontrolled dm  ckd stage 3 Continue lisinopril 2.5 mg daily. Monitor bmp with her on lasix.   Hypothyroidism Reviewed tsh, continue current regimen synthroid  Hyperlipidemia Lipid Panel     Component Value Date/Time   CHOL 182 04/29/2015   TRIG 188 (A) 04/29/2015  HDL 39 04/29/2015   LDLCALC 105 04/29/2015  given her uncontrolled DM, increase her zocor to 10 mg daily for cardiovascular protection. Check lipid panel in 3 month   Oneal GroutMAHIMA Enma Maeda, MD Internal Medicine [facility] [address] [place], [place] [address] Cell Phone (Monday-Friday 8 am - 5 pm): [phone] On Call: [phone] and follow prompts after 5 pm and on weekends Office Phone: [phone] Office Fax: [phone]

## 2015-11-22 ENCOUNTER — Non-Acute Institutional Stay (SKILLED_NURSING_FACILITY): Payer: Medicare Other | Admitting: Internal Medicine

## 2015-11-22 ENCOUNTER — Encounter: Payer: Self-pay | Admitting: Internal Medicine

## 2015-11-22 DIAGNOSIS — M159 Polyosteoarthritis, unspecified: Secondary | ICD-10-CM

## 2015-11-22 DIAGNOSIS — I69359 Hemiplegia and hemiparesis following cerebral infarction affecting unspecified side: Secondary | ICD-10-CM | POA: Diagnosis not present

## 2015-11-22 DIAGNOSIS — I130 Hypertensive heart and chronic kidney disease with heart failure and stage 1 through stage 4 chronic kidney disease, or unspecified chronic kidney disease: Secondary | ICD-10-CM

## 2015-11-22 DIAGNOSIS — I509 Heart failure, unspecified: Secondary | ICD-10-CM

## 2015-11-22 NOTE — Progress Notes (Signed)
Patient ID: Mia Calhoun, female   DOB: [birth year], 75 y.o.   MRN: [id number]      [facility]  Code Status - Full Code  Chief Complaint  Patient presents with  . Medical Management of Chronic Issues    Routine Visit   No Known Allergies  Advanced Directives 08/12/2015  Does patient have an advance directive? No  Would patient like information on creating an advanced directive? No - patient declined information     HPI 75 y/o patient is seen for routine visit. Denies any concerns. Limited HPI and ROS.   ROS- limited with her aphasia   Past Medical History:  Diagnosis Date  . Anemia, unspecified   . 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 CKD (chronic kidney disease) stage 3, GFR 30-59 ml/min   . Congestive heart failure, unspecified   . Depressive disorder, not elsewhere classified   . Edema   . Hemiplegia, unspecified, affecting unspecified side   . 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 Nontoxic multinodular goiter   . Osteoporosis, unspecified   . Type I (juvenile type) diabetes mellitus with neurological manifestations, uncontrolled   . Unspecified constipation   . Unspecified disorder of kidney and ureter   . Unspecified essential hypertension   . Unspecified hereditary and idiopathic peripheral neuropathy   . Unspecified hypothyroidism   . Unspecified late effects of cerebrovascular disease   . Unspecified vitamin D deficiency      Medication   Medication List       Accurate as of 11/22/15 12:49 PM. Always use your most recent med list.          acetaminophen 650 MG CR tablet Commonly known as:  TYLENOL Take 650 mg by mouth every 6 (six) hours as needed for pain.   aspirin EC 81 MG tablet Take 81 mg by mouth daily.   CERTAVITE/ANTIOXIDANTS Tabs Take 1 tablet by mouth daily.   cholecalciferol 1000 units tablet Commonly known as:  VITAMIN D Take 2,000 Units by mouth  daily.   fluticasone 50 MCG/ACT nasal spray Commonly known as:  FLONASE Place 2 sprays into both nostrils 2 (two) times daily.   furosemide 20 MG tablet Commonly known as:  LASIX Take 20 mg by mouth daily. Take 1 tablet daily for edema.   guaifenesin 100 MG/5ML syrup Commonly known as:  ROBITUSSIN Take 100 mg by mouth every 8 (eight) hours as needed for cough. Take 10 mL by mouth every 8 hours as needed for cough   insulin glargine 100 UNIT/ML injection Commonly known as:  LANTUS Inject 35 Units into the skin at bedtime.   insulin lispro 100 UNIT/ML injection Commonly known as:  HUMALOG Inject 10 Units into the skin 3 (three) times daily before meals.   iron polysaccharides 150 MG capsule Commonly known as:  NIFEREX Take 150 mg by mouth 2 (two) times daily. Take 1 tablet daily for anemia.   levothyroxine 50 MCG tablet Commonly known as:  SYNTHROID, LEVOTHROID Take 50 mcg by mouth daily before breakfast. Take 1 tablet daily for thyroid therapy.   lisinopril 2.5 MG tablet Commonly known as:  PRINIVIL,ZESTRIL Take 2.5 mg by mouth daily. Take 1 tablet by mouth for HTN /renal protection.   Melatonin 3 MG Tabs Take 3 mg by mouth at bedtime. Take 30 minutes before bedtime   metoprolol succinate 50 MG 24 hr tablet Commonly known as:  TOPROL-XL Take 50 mg by mouth  daily. Take 1 tablet by mouth daily for HTN,  with or immediately following a meal.   polyvinyl alcohol 1.4 % ophthalmic solution Commonly known as:  LIQUIFILM TEARS 1 drop 4 (four) times daily as needed for dry eyes. Wait 3-5 minutes between 2 eye meds   sennosides-docusate sodium 8.6-50 MG tablet Commonly known as:  SENOKOT-S Take 1 tablet by mouth 2 (two) times daily. Take 1 tablet twice daily for constipation.   simvastatin 10 MG tablet Commonly known as:  ZOCOR Take 10 mg by mouth daily.   spironolactone 25 MG tablet Commonly known as:  ALDACTONE Take 12.5 mg by mouth daily. For HTN, CHF        Physical exam BP 140/82   Pulse 76   Temp (!) 96.8 F (36 C) (Oral)   Resp 20   Ht 5\' 7"  (1.702 m)   Wt 177 lb 3.2 oz (80.4 kg)   SpO2 100%   BMI 27.75 kg/m   Wt Readings from Last 3 Encounters:  11/22/15 177 lb 3.2 oz (80.4 kg)  10/20/15 178 lb 9.6 oz (81 kg)  09/09/15 180 lb 12.8 oz (82 kg)   Constitutional: elderly patient, overweight with expressive aphasia    Head: atraumatic, normocephalic Throat: MMM, poor dentition Nose: no sinus tenderness, no nasal discharge Cardiovascular: Normal rate, regular rhythm and normal heart sounds. Weak dp pulses   Pulmonary/Chest: CTAB Abdominal: Bowel sounds are normal.  Musculoskeletal: no leg edema. right sided hemiparesis, able to propel in wheelchair by left side  Neurological: Alert. Can make her needs known. kyphosis + Skin: warm and dry     Labs-   CBC Latest Ref Rng & Units 10/06/2015 05/19/2008  WBC 10:3/mL 8.1 5.2  Hemoglobin 12.0 - 16.0 g/dL 11.6(A) 12.7  Hematocrit 36 - 46 % 38 39.2  Platelets 150 - 399 K/L 292 239   Lab Results  Component Value Date   HGBA1C 14.5 10/06/2015   Lipid Panel     Component Value Date/Time   CHOL 182 04/29/2015   TRIG 188 (A) 04/29/2015   HDL 39 04/29/2015   LDLCALC 105 04/29/2015   Lab Results  Component Value Date   TSH 1.94 04/29/2015    05/26/14 wbc 10.9, hb 10.8, hct 35.6, plt 315, mcv 76.6, na 138, k 5.8, glu 76, bun 42, cr 1.73, alb 3.3, lft wnl, t.chol 115, tg 115, a1c 7.7   Assessment/plan  Right hemiplegia post cva Has residual deficit from her cva. Continue baby aspirin and statin. Tolerating increased dosing of statin well.   Hypertensive heart and renal disease Monitor bp reading, continue toprol xl and lisinopril. Monitor bmp periodically  OA Tylenol 650 mg q6h prn pain    Oneal GroutMAHIMA Chairty Toman, MD Internal Medicine [facility] [address] [place], [place] [address] Cell Phone (Monday-Friday 8 am - 5 pm):  [phone] On Call: [phone] and follow prompts after 5 pm and on weekends Office Phone: [phone] Office Fax: [phone]

## 2015-12-03 ENCOUNTER — Encounter: Payer: Self-pay | Admitting: Family

## 2015-12-03 NOTE — Progress Notes (Signed)
This encounter was created in error - please disregard.

## 2015-12-03 NOTE — Progress Notes (Deleted)
Location:  [facility] Room Number: 505 Place of Service:  SNF [phone] Provider:  [name]***  PANDEY, MAHIMA, MD  Patient Care Team: Oneal GroutMahima Pandey, MD as PCP - General (Internal Medicine)  Extended Emergency Contact Information Primary Emergency Contact: Kucinski,Babett Address: [address]          [place], [place] Home Phone: [phone] Mobile Phone: [phone] Relation: Daughter  Code Status:  *** Goals of care: Advanced Directive information Advanced Directives 12/03/2015  Does patient have an advance directive? No  Would patient like information on creating an advanced directive? -     Chief Complaint  Patient presents with  . Acute Visit    Elevated blood glucose    HPI:  Pt is a 75 y.o. female seen today for an acute visit for    Past Medical History:  Diagnosis Date  . Anemia, unspecified   . 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 CKD (chronic kidney disease) stage 3, GFR 30-59 ml/min   . Congestive heart failure, unspecified   . Depressive disorder, not elsewhere classified   . Edema   . Hemiplegia, unspecified, affecting unspecified side   . 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 Nontoxic multinodular goiter   . Osteoporosis, unspecified   . 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 Unspecified constipation   . Unspecified disorder of kidney and ureter   . Unspecified essential hypertension   . Unspecified hereditary and idiopathic peripheral neuropathy   . Unspecified hypothyroidism   . Unspecified late effects of cerebrovascular disease   . Unspecified vitamin D deficiency    Past Surgical History:  Procedure Laterality Date  . EYE SURGERY Left 03/22/2009   h/o diabetic retinopathy    No Known Allergies    Medication List       Accurate as of 12/03/15 11:04 AM. Always use your most  recent med list.          acetaminophen 650 MG CR tablet Commonly known as:  TYLENOL Take 650 mg by mouth every 6 (six) hours as needed for pain.   aspirin EC 81 MG tablet Take 81 mg by mouth daily.   CERTAVITE/ANTIOXIDANTS Tabs Take 1 tablet by mouth daily.   cholecalciferol 1000 units tablet Commonly known as:  VITAMIN D Take 2,000 Units by mouth daily.   fluticasone 50 MCG/ACT nasal spray Commonly known as:  FLONASE Place 2 sprays into both nostrils 2 (two) times daily.   furosemide 20 MG tablet Commonly known as:  LASIX Take 20 mg by mouth daily. Take 1 tablet daily for edema.   guaifenesin 100 MG/5ML syrup Commonly known as:  ROBITUSSIN Take 10 mL by mouth every 8 hours as needed for cough   insulin glargine 100 UNIT/ML injection Commonly known as:  LANTUS Inject 35 Units into the skin at bedtime.   insulin lispro 100 UNIT/ML injection Commonly known as:  HUMALOG Inject 10 Units into the skin 3 (three) times daily before meals.   iron polysaccharides 150 MG capsule Commonly known as:  NIFEREX Take 150 mg by mouth 2 (two) times daily. Take 1 tablet daily for anemia.   levothyroxine 50 MCG tablet Commonly known as:  SYNTHROID, LEVOTHROID Take 50 mcg by mouth daily before breakfast. Take 1 tablet daily for thyroid therapy.   lisinopril 2.5 MG tablet Commonly known as:  PRINIVIL,ZESTRIL Take 2.5 mg by mouth daily. Take 1 tablet by  mouth for HTN /renal protection.   Melatonin 3 MG Tabs Take 3 mg by mouth at bedtime. Take 30 minutes before bedtime   metoprolol succinate 50 MG 24 hr tablet Commonly known as:  TOPROL-XL Take 50 mg by mouth daily. Take 1 tablet by mouth daily for HTN,  with or immediately following a meal.   polyvinyl alcohol 1.4 % ophthalmic solution Commonly known as:  LIQUIFILM TEARS 1 drop 4 (four) times daily as needed for dry eyes. Wait 3-5 minutes between 2 eye meds   sennosides-docusate sodium 8.6-50 MG tablet Commonly known as:   SENOKOT-S Take 1 tablet by mouth 2 (two) times daily. Take 1 tablet twice daily for constipation.   simvastatin 10 MG tablet Commonly known as:  ZOCOR Take 10 mg by mouth daily.   spironolactone 25 MG tablet Commonly known as:  ALDACTONE Take 12.5 mg by mouth daily. For HTN, CHF       Review of Systems  Immunization History  Administered Date(s) Administered  . Influenza Whole 03/08/2011  . Influenza-Unspecified 12/16/2012, 12/11/2013, 12/29/2014  . PPD Test 07/26/2010, 11/25/2012, 11/27/2013, 11/13/2014  . Pneumococcal Polysaccharide-23 02/26/2010  . Pneumococcal-Unspecified 12/27/2009, 11/25/2015, 11/30/2015   Pertinent  Health Maintenance Due  Topic Date Due  . MAMMOGRAM  02/28/2016 (Originally 03/15/2012)  . PNA vac Low Risk Adult (2 of 2 - PCV13) 02/28/2016 (Originally 02/27/2011)  . OPHTHALMOLOGY EXAM  08/11/2016 (Originally 08/04/2015)  . INFLUENZA VACCINE  02/27/2017 (Originally 09/28/2015)  . DEXA SCAN  02/28/2023 (Originally 11/03/2007)  . FOOT EXAM  03/16/2016  . HEMOGLOBIN A1C  04/07/2016  . COLONOSCOPY  10/24/2023   Fall Risk  10/23/2013  Falls in the past year? No   Functional Status Survey:    Vitals:   12/03/15 1049  BP: 131/60  Pulse: 89  Resp: 19  Temp: 97.2 F (36.2 C)  SpO2: 96%  Weight: 178 lb 1.6 oz (80.8 kg)  Height: 5\' 7"  (1.702 m)   Body mass index is 27.89 kg/m. Physical Exam  Labs reviewed:  Recent Labs  04/29/15 10/06/15  NA 144 130*  K 3.9 5.2  BUN 14 60*  CREATININE 0.6 1.8*    Recent Labs  04/29/15 10/06/15  AST 12* 13  ALT 10 11  ALKPHOS 49 100    Recent Labs  10/06/15  WBC 8.1  HGB 11.6*  HCT 38  PLT 292   Lab Results  Component Value Date   TSH 1.94 04/29/2015   Lab Results  Component Value Date   HGBA1C 14.5 10/06/2015   Lab Results  Component Value Date   CHOL 182 04/29/2015   HDL 39 04/29/2015   LDLCALC 105 04/29/2015   TRIG 188 (A) 04/29/2015    Significant Diagnostic Results in last 30 days:   No results found.  Assessment/Plan There are no diagnoses linked to this encounter.   Family/ staff Communication: ***  Labs/tests ordered:  ***

## 2015-12-13 ENCOUNTER — Encounter: Payer: Self-pay | Admitting: Internal Medicine

## 2015-12-13 ENCOUNTER — Non-Acute Institutional Stay (SKILLED_NURSING_FACILITY): Payer: Medicare Other | Admitting: Internal Medicine

## 2015-12-13 DIAGNOSIS — I69320 Aphasia following cerebral infarction: Secondary | ICD-10-CM

## 2015-12-13 DIAGNOSIS — I5032 Chronic diastolic (congestive) heart failure: Secondary | ICD-10-CM

## 2015-12-13 DIAGNOSIS — I6930 Unspecified sequelae of cerebral infarction: Secondary | ICD-10-CM | POA: Diagnosis not present

## 2015-12-13 NOTE — Progress Notes (Signed)
LOCATION: [facility]  PCP: [name], MD   Code Status: full code  Goals of care: Advanced Directive information Advanced Directives 12/03/2015  Does patient have an advance directive? No  Would patient like information on creating an advanced directive? -       Extended Emergency Contact Information Primary Emergency Contact: Streeter,Babett Address: [address]          [place], [place] Home Phone: [phone] Mobile Phone: [phone] Relation: Daughter   No Known Allergies  Chief Complaint  Patient presents with  . Medical Management of Chronic Issues    Routine Visit     HPI:  Patient is a 75 y.o. female seen today for routine visit. She has been at her baseline. No fall reported. No new concern from nursing staff.    Review of Systems: unable to obtain    Past Medical History:  Diagnosis Date  . Anemia, unspecified   . 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 CKD (chronic kidney disease) stage 3, GFR 30-59 ml/min   . Congestive heart failure, unspecified   . Depressive disorder, not elsewhere classified   . Edema   . Hemiplegia, unspecified, affecting unspecified side   . 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 Nontoxic multinodular goiter   . Osteoporosis, unspecified   . 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 Unspecified constipation   . Unspecified disorder of kidney and ureter   . Unspecified essential hypertension   . Unspecified hereditary and idiopathic peripheral neuropathy   . Unspecified hypothyroidism   . Unspecified late effects of cerebrovascular disease   . Unspecified vitamin D deficiency    Past Surgical History:  Procedure Laterality Date  . EYE SURGERY Left 03/22/2009   h/o diabetic retinopathy    Medications:   Medication List       Accurate as of 12/13/15  3:47 PM.  Always use your most recent med list.          acetaminophen 650 MG CR tablet Commonly known as:  TYLENOL Take 650 mg by mouth every 6 (six) hours as needed for pain.   aspirin EC 81 MG tablet Take 81 mg by mouth daily.   CERTAVITE/ANTIOXIDANTS Tabs Take 1 tablet by mouth daily.   cholecalciferol 1000 units tablet Commonly known as:  VITAMIN D Take 2,000 Units by mouth daily.   fluticasone 50 MCG/ACT nasal spray Commonly known as:  FLONASE Place 2 sprays into both nostrils 2 (two) times daily.   furosemide 20 MG tablet Commonly known as:  LASIX Take 20 mg by mouth daily. Take 1 tablet daily for edema.   guaifenesin 100 MG/5ML syrup Commonly known as:  ROBITUSSIN Take 10 mL by mouth every 8 hours as needed for cough   insulin glargine 100 UNIT/ML injection Commonly known as:  LANTUS Inject 35 Units into the skin at bedtime.   insulin lispro 100 UNIT/ML injection Commonly known as:  HUMALOG Inject 10 Units into the skin 3 (three) times daily before meals.   iron polysaccharides 150 MG capsule Commonly known as:  NIFEREX Take 150 mg by mouth 2 (two) times daily. Take 1 tablet daily for anemia.   levothyroxine 50 MCG tablet Commonly known as:  SYNTHROID, LEVOTHROID Take 50 mcg by mouth daily before breakfast. Take 1 tablet daily for thyroid therapy.   lisinopril 2.5 MG tablet Commonly known as:  PRINIVIL,ZESTRIL Take 2.5 mg by mouth  daily. Take 1 tablet by mouth for HTN /renal protection.   Melatonin 3 MG Tabs Take 3 mg by mouth at bedtime. Take 30 minutes before bedtime   metoprolol succinate 50 MG 24 hr tablet Commonly known as:  TOPROL-XL Take 50 mg by mouth daily. Take 1 tablet by mouth daily for HTN,  with or immediately following a meal.   polyvinyl alcohol 1.4 % ophthalmic solution Commonly known as:  LIQUIFILM TEARS 1 drop 4 (four) times daily as needed for dry eyes. Wait 3-5 minutes between 2 eye meds   sennosides-docusate sodium 8.6-50 MG  tablet Commonly known as:  SENOKOT-S Take 1 tablet by mouth 2 (two) times daily. Take 1 tablet twice daily for constipation.   simvastatin 10 MG tablet Commonly known as:  ZOCOR Take 10 mg by mouth daily.   spironolactone 25 MG tablet Commonly known as:  ALDACTONE Take 12.5 mg by mouth daily. For HTN, CHF       Immunizations: Immunization History  Administered Date(s) Administered  . Influenza Whole 03/08/2011  . Influenza-Unspecified 12/16/2012, 12/11/2013, 12/29/2014  . PPD Test 07/26/2010, 11/25/2012, 11/27/2013, 11/13/2014  . Pneumococcal Polysaccharide-23 02/26/2010  . Pneumococcal-Unspecified 12/27/2009, 11/25/2015, 11/30/2015     Physical Exam:  Vitals:   12/13/15 1539  BP: 128/82  Pulse: 68  Resp: 20  Temp: 98.1 F (36.7 C)  TempSrc: Oral  SpO2: 96%  Weight: 178 lb 1.6 oz (80.8 kg)  Height: 5\' 7"  (1.702 m)   Body mass index is 27.89 kg/m.  Constitutional: elderly patient, overweight with expressive aphasia    Head: atraumatic, normocephalic Throat: MMM, poor dentition Nose: no nasal discharge Cardiovascular: normal s1,s2, no murmur, no leg edema  Pulmonary/Chest: CTAB Abdominal: Bowel sounds are normal. Non tender. No guarding/ rigidity Musculoskeletal: No leg edema. Right sided hemiparesis, able to propel in wheelchair by left side. Neurological: Alert. Can make her needs known. Kyphosis + Skin: warm and dry       Labs reviewed: Basic Metabolic Panel:  Recent Labs  16/11/9601/02/17 10/06/15  NA 144 130*  K 3.9 5.2  BUN 14 60*  CREATININE 0.6 1.8*   Liver Function Tests:  Recent Labs  04/29/15 10/06/15  AST 12* 13  ALT 10 11  ALKPHOS 49 100   No results for input(s): LIPASE, AMYLASE in the last 8760 hours. No results for input(s): AMMONIA in the last 8760 hours. CBC:  Recent Labs  10/06/15  WBC 8.1  HGB 11.6*  HCT 38  PLT 292    Assessment/Plan  Aphasia post cva Limits her communication. Able to express her basic needs. Monitor  clinically.   History of CVA Continue aspirin, continue antihypertensives  Chronic diastolic chf Stable, continue metoprolol succinate 50 mg daily, spironolactone 12.5 mg daily with lasix 20 mg daily and lisinopril 2.5 mg daily.      Oneal GroutMAHIMA Bryttney Netzer, MD Internal Medicine [facility] [address] [place], [place] [address] Cell Phone (Monday-Friday 8 am - 5 pm): [phone] On Call: [phone] and follow prompts after 5 pm and on weekends Office Phone: [phone] Office Fax: [phone]

## 2015-12-14 DIAGNOSIS — I6930 Unspecified sequelae of cerebral infarction: Secondary | ICD-10-CM | POA: Insufficient documentation

## 2015-12-14 DIAGNOSIS — I69320 Aphasia following cerebral infarction: Secondary | ICD-10-CM | POA: Insufficient documentation

## 2016-02-01 ENCOUNTER — Inpatient Hospital Stay (HOSPITAL_COMMUNITY): Payer: Medicare Other

## 2016-02-01 ENCOUNTER — Encounter (HOSPITAL_COMMUNITY): Payer: Self-pay | Admitting: Emergency Medicine

## 2016-02-01 ENCOUNTER — Emergency Department (HOSPITAL_COMMUNITY): Payer: Medicare Other

## 2016-02-01 ENCOUNTER — Inpatient Hospital Stay (HOSPITAL_COMMUNITY)
Admission: EM | Admit: 2016-02-01 | Discharge: 2016-02-04 | DRG: 064 | Disposition: A | Discharge status: Hospice | Payer: Medicare Other | Attending: Internal Medicine | Admitting: Internal Medicine

## 2016-02-01 DIAGNOSIS — R569 Unspecified convulsions: Secondary | ICD-10-CM

## 2016-02-01 DIAGNOSIS — R32 Unspecified urinary incontinence: Secondary | ICD-10-CM | POA: Diagnosis present

## 2016-02-01 DIAGNOSIS — E118 Type 2 diabetes mellitus with unspecified complications: Secondary | ICD-10-CM | POA: Insufficient documentation

## 2016-02-01 DIAGNOSIS — I69320 Aphasia following cerebral infarction: Secondary | ICD-10-CM | POA: Diagnosis not present

## 2016-02-01 DIAGNOSIS — Z993 Dependence on wheelchair: Secondary | ICD-10-CM

## 2016-02-01 DIAGNOSIS — E669 Obesity, unspecified: Secondary | ICD-10-CM

## 2016-02-01 DIAGNOSIS — E042 Nontoxic multinodular goiter: Secondary | ICD-10-CM | POA: Diagnosis present

## 2016-02-01 DIAGNOSIS — I69351 Hemiplegia and hemiparesis following cerebral infarction affecting right dominant side: Secondary | ICD-10-CM | POA: Diagnosis not present

## 2016-02-01 DIAGNOSIS — R1310 Dysphagia, unspecified: Secondary | ICD-10-CM | POA: Diagnosis present

## 2016-02-01 DIAGNOSIS — Z66 Do not resuscitate: Secondary | ICD-10-CM | POA: Diagnosis present

## 2016-02-01 DIAGNOSIS — I130 Hypertensive heart and chronic kidney disease with heart failure and stage 1 through stage 4 chronic kidney disease, or unspecified chronic kidney disease: Secondary | ICD-10-CM | POA: Diagnosis present

## 2016-02-01 DIAGNOSIS — F17200 Nicotine dependence, unspecified, uncomplicated: Secondary | ICD-10-CM | POA: Diagnosis present

## 2016-02-01 DIAGNOSIS — G9340 Encephalopathy, unspecified: Secondary | ICD-10-CM | POA: Insufficient documentation

## 2016-02-01 DIAGNOSIS — E1169 Type 2 diabetes mellitus with other specified complication: Secondary | ICD-10-CM | POA: Diagnosis present

## 2016-02-01 DIAGNOSIS — I5032 Chronic diastolic (congestive) heart failure: Secondary | ICD-10-CM | POA: Diagnosis present

## 2016-02-01 DIAGNOSIS — J189 Pneumonia, unspecified organism: Secondary | ICD-10-CM | POA: Diagnosis not present

## 2016-02-01 DIAGNOSIS — G4089 Other seizures: Secondary | ICD-10-CM | POA: Diagnosis present

## 2016-02-01 DIAGNOSIS — E1165 Type 2 diabetes mellitus with hyperglycemia: Secondary | ICD-10-CM | POA: Diagnosis present

## 2016-02-01 DIAGNOSIS — E1149 Type 2 diabetes mellitus with other diabetic neurological complication: Secondary | ICD-10-CM | POA: Diagnosis present

## 2016-02-01 DIAGNOSIS — D638 Anemia in other chronic diseases classified elsewhere: Secondary | ICD-10-CM | POA: Diagnosis present

## 2016-02-01 DIAGNOSIS — R29810 Facial weakness: Secondary | ICD-10-CM | POA: Diagnosis present

## 2016-02-01 DIAGNOSIS — K219 Gastro-esophageal reflux disease without esophagitis: Secondary | ICD-10-CM | POA: Diagnosis present

## 2016-02-01 DIAGNOSIS — D509 Iron deficiency anemia, unspecified: Secondary | ICD-10-CM | POA: Diagnosis present

## 2016-02-01 DIAGNOSIS — J439 Emphysema, unspecified: Secondary | ICD-10-CM | POA: Diagnosis present

## 2016-02-01 DIAGNOSIS — E261 Secondary hyperaldosteronism: Secondary | ICD-10-CM | POA: Diagnosis present

## 2016-02-01 DIAGNOSIS — Z79899 Other long term (current) drug therapy: Secondary | ICD-10-CM

## 2016-02-01 DIAGNOSIS — N183 Chronic kidney disease, stage 3 unspecified: Secondary | ICD-10-CM | POA: Diagnosis present

## 2016-02-01 DIAGNOSIS — E1122 Type 2 diabetes mellitus with diabetic chronic kidney disease: Secondary | ICD-10-CM | POA: Diagnosis present

## 2016-02-01 DIAGNOSIS — E113299 Type 2 diabetes mellitus with mild nonproliferative diabetic retinopathy without macular edema, unspecified eye: Secondary | ICD-10-CM | POA: Diagnosis present

## 2016-02-01 DIAGNOSIS — F329 Major depressive disorder, single episode, unspecified: Secondary | ICD-10-CM | POA: Diagnosis present

## 2016-02-01 DIAGNOSIS — Z794 Long term (current) use of insulin: Secondary | ICD-10-CM

## 2016-02-01 DIAGNOSIS — I63411 Cerebral infarction due to embolism of right middle cerebral artery: Secondary | ICD-10-CM | POA: Diagnosis present

## 2016-02-01 DIAGNOSIS — E1151 Type 2 diabetes mellitus with diabetic peripheral angiopathy without gangrene: Secondary | ICD-10-CM | POA: Diagnosis present

## 2016-02-01 DIAGNOSIS — E559 Vitamin D deficiency, unspecified: Secondary | ICD-10-CM | POA: Diagnosis present

## 2016-02-01 DIAGNOSIS — N179 Acute kidney failure, unspecified: Secondary | ICD-10-CM | POA: Diagnosis present

## 2016-02-01 DIAGNOSIS — Z7189 Other specified counseling: Secondary | ICD-10-CM

## 2016-02-01 DIAGNOSIS — I10 Essential (primary) hypertension: Secondary | ICD-10-CM

## 2016-02-01 DIAGNOSIS — E869 Volume depletion, unspecified: Secondary | ICD-10-CM | POA: Diagnosis present

## 2016-02-01 DIAGNOSIS — E039 Hypothyroidism, unspecified: Secondary | ICD-10-CM | POA: Diagnosis present

## 2016-02-01 DIAGNOSIS — Z515 Encounter for palliative care: Secondary | ICD-10-CM

## 2016-02-01 DIAGNOSIS — E785 Hyperlipidemia, unspecified: Secondary | ICD-10-CM | POA: Diagnosis present

## 2016-02-01 DIAGNOSIS — J690 Pneumonitis due to inhalation of food and vomit: Secondary | ICD-10-CM | POA: Diagnosis present

## 2016-02-01 DIAGNOSIS — I639 Cerebral infarction, unspecified: Secondary | ICD-10-CM | POA: Diagnosis not present

## 2016-02-01 DIAGNOSIS — I63511 Cerebral infarction due to unspecified occlusion or stenosis of right middle cerebral artery: Secondary | ICD-10-CM

## 2016-02-01 DIAGNOSIS — I69359 Hemiplegia and hemiparesis following cerebral infarction affecting unspecified side: Secondary | ICD-10-CM | POA: Diagnosis not present

## 2016-02-01 DIAGNOSIS — Z6833 Body mass index (BMI) 33.0-33.9, adult: Secondary | ICD-10-CM

## 2016-02-01 DIAGNOSIS — I6789 Other cerebrovascular disease: Secondary | ICD-10-CM | POA: Diagnosis not present

## 2016-02-01 DIAGNOSIS — R159 Full incontinence of feces: Secondary | ICD-10-CM | POA: Diagnosis present

## 2016-02-01 DIAGNOSIS — Z7401 Bed confinement status: Secondary | ICD-10-CM

## 2016-02-01 HISTORY — DX: Cerebral infarction, unspecified: I63.9

## 2016-02-01 LAB — I-STAT CHEM 8, ED
BUN: 41 mg/dL — ABNORMAL HIGH (ref 6–20)
CREATININE: 1.7 mg/dL — AB (ref 0.44–1.00)
Calcium, Ion: 1.1 mmol/L — ABNORMAL LOW (ref 1.15–1.40)
Chloride: 104 mmol/L (ref 101–111)
Glucose, Bld: 191 mg/dL — ABNORMAL HIGH (ref 65–99)
HEMATOCRIT: 42 % (ref 36.0–46.0)
HEMOGLOBIN: 14.3 g/dL (ref 12.0–15.0)
POTASSIUM: 5.1 mmol/L (ref 3.5–5.1)
Sodium: 137 mmol/L (ref 135–145)
TCO2: 23 mmol/L (ref 0–100)

## 2016-02-01 LAB — DIFFERENTIAL
BASOS ABS: 0 10*3/uL (ref 0.0–0.1)
Basophils Relative: 0 %
EOS ABS: 0.2 10*3/uL (ref 0.0–0.7)
Eosinophils Relative: 1 %
LYMPHS PCT: 15 %
Lymphs Abs: 2.3 10*3/uL (ref 0.7–4.0)
Monocytes Absolute: 0.9 10*3/uL (ref 0.1–1.0)
Monocytes Relative: 6 %
NEUTROS ABS: 11.6 10*3/uL — AB (ref 1.7–7.7)
NEUTROS PCT: 78 %

## 2016-02-01 LAB — URINALYSIS, ROUTINE W REFLEX MICROSCOPIC
Bilirubin Urine: NEGATIVE
GLUCOSE, UA: NEGATIVE mg/dL
Hgb urine dipstick: NEGATIVE
Ketones, ur: NEGATIVE mg/dL
LEUKOCYTES UA: NEGATIVE
NITRITE: NEGATIVE
PROTEIN: NEGATIVE mg/dL
Specific Gravity, Urine: 1.011 (ref 1.005–1.030)
pH: 5 (ref 5.0–8.0)

## 2016-02-01 LAB — COMPREHENSIVE METABOLIC PANEL
ALT: 10 U/L — AB (ref 14–54)
AST: 16 U/L (ref 15–41)
Albumin: 3.4 g/dL — ABNORMAL LOW (ref 3.5–5.0)
Alkaline Phosphatase: 77 U/L (ref 38–126)
Anion gap: 13 (ref 5–15)
BUN: 42 mg/dL — ABNORMAL HIGH (ref 6–20)
CHLORIDE: 104 mmol/L (ref 101–111)
CO2: 19 mmol/L — AB (ref 22–32)
CREATININE: 1.65 mg/dL — AB (ref 0.44–1.00)
Calcium: 9 mg/dL (ref 8.9–10.3)
GFR calc non Af Amer: 30 mL/min — ABNORMAL LOW (ref >60)
GFR, EST AFRICAN AMERICAN: 34 mL/min — AB (ref >60)
Glucose, Bld: 185 mg/dL — ABNORMAL HIGH (ref 65–99)
Potassium: 5.1 mmol/L (ref 3.5–5.1)
SODIUM: 136 mmol/L (ref 135–145)
Total Bilirubin: 0.5 mg/dL (ref 0.3–1.2)
Total Protein: 7.3 g/dL (ref 6.5–8.1)

## 2016-02-01 LAB — CBC
HCT: 38.6 % (ref 36.0–46.0)
Hemoglobin: 12.5 g/dL (ref 12.0–15.0)
MCH: 23.5 pg — ABNORMAL LOW (ref 26.0–34.0)
MCHC: 32.4 g/dL (ref 30.0–36.0)
MCV: 72.4 fL — ABNORMAL LOW (ref 78.0–100.0)
PLATELETS: 172 10*3/uL (ref 150–400)
RBC: 5.33 MIL/uL — AB (ref 3.87–5.11)
RDW: 15 % (ref 11.5–15.5)
WBC: 15 10*3/uL — AB (ref 4.0–10.5)

## 2016-02-01 LAB — TSH: TSH: 0.644 u[IU]/mL (ref 0.350–4.500)

## 2016-02-01 LAB — I-STAT TROPONIN, ED: Troponin i, poc: 0 ng/mL (ref 0.00–0.08)

## 2016-02-01 LAB — APTT: APTT: 25 s (ref 24–36)

## 2016-02-01 LAB — GLUCOSE, CAPILLARY
GLUCOSE-CAPILLARY: 194 mg/dL — AB (ref 65–99)
GLUCOSE-CAPILLARY: 164 mg/dL — AB (ref 65–99)
Glucose-Capillary: 204 mg/dL — ABNORMAL HIGH (ref 65–99)

## 2016-02-01 LAB — MRSA PCR SCREENING: MRSA BY PCR: NEGATIVE

## 2016-02-01 LAB — PROTIME-INR
INR: 1.08
PROTHROMBIN TIME: 14 s (ref 11.4–15.2)

## 2016-02-01 LAB — CBG MONITORING, ED: Glucose-Capillary: 196 mg/dL — ABNORMAL HIGH (ref 65–99)

## 2016-02-01 MED ORDER — LORAZEPAM 2 MG/ML IJ SOLN
INTRAMUSCULAR | Status: AC
  Administered 2016-02-01: 2 mg
  Filled 2016-02-01: qty 1

## 2016-02-01 MED ORDER — ENOXAPARIN SODIUM 30 MG/0.3ML ~~LOC~~ SOLN
30.0000 mg | SUBCUTANEOUS | Status: DC
  Administered 2016-02-01 – 2016-02-02 (×2): 30 mg via SUBCUTANEOUS
  Filled 2016-02-01 (×2): qty 0.3

## 2016-02-01 MED ORDER — CHLORHEXIDINE GLUCONATE 0.12 % MT SOLN
15.0000 mL | Freq: Two times a day (BID) | OROMUCOSAL | Status: DC
  Administered 2016-02-02 – 2016-02-03 (×4): 15 mL via OROMUCOSAL
  Filled 2016-02-01 (×4): qty 15

## 2016-02-01 MED ORDER — SODIUM CHLORIDE 0.9 % IV SOLN
INTRAVENOUS | Status: DC
  Administered 2016-02-01 – 2016-02-03 (×4): via INTRAVENOUS

## 2016-02-01 MED ORDER — POLYVINYL ALCOHOL 1.4 % OP SOLN
1.0000 [drp] | Freq: Four times a day (QID) | OPHTHALMIC | Status: DC | PRN
  Filled 2016-02-01: qty 15

## 2016-02-01 MED ORDER — STROKE: EARLY STAGES OF RECOVERY BOOK
Freq: Once | Status: AC
  Administered 2016-02-03: 01:00:00
  Filled 2016-02-01 (×2): qty 1

## 2016-02-01 MED ORDER — SODIUM CHLORIDE 0.9 % IV SOLN
100.0000 mg | Freq: Two times a day (BID) | INTRAVENOUS | Status: DC
  Administered 2016-02-01 – 2016-02-04 (×7): 100 mg via INTRAVENOUS
  Filled 2016-02-01 (×14): qty 10

## 2016-02-01 MED ORDER — ASPIRIN 325 MG PO TABS
325.0000 mg | ORAL_TABLET | Freq: Every day | ORAL | Status: DC
  Filled 2016-02-01: qty 1

## 2016-02-01 MED ORDER — INSULIN GLARGINE 100 UNIT/ML ~~LOC~~ SOLN
20.0000 [IU] | Freq: Every day | SUBCUTANEOUS | Status: DC
  Administered 2016-02-01: 20 [IU] via SUBCUTANEOUS
  Filled 2016-02-01 (×2): qty 0.2

## 2016-02-01 MED ORDER — INSULIN ASPART 100 UNIT/ML ~~LOC~~ SOLN
0.0000 [IU] | SUBCUTANEOUS | Status: DC
  Administered 2016-02-01: 3 [IU] via SUBCUTANEOUS
  Administered 2016-02-01 – 2016-02-02 (×3): 2 [IU] via SUBCUTANEOUS
  Administered 2016-02-02 – 2016-02-04 (×5): 1 [IU] via SUBCUTANEOUS

## 2016-02-01 MED ORDER — ASPIRIN 300 MG RE SUPP
300.0000 mg | Freq: Every day | RECTAL | Status: DC
  Administered 2016-02-01 – 2016-02-03 (×3): 300 mg via RECTAL
  Filled 2016-02-01 (×3): qty 1

## 2016-02-01 MED ORDER — LEVOTHYROXINE SODIUM 100 MCG IV SOLR
25.0000 ug | Freq: Every day | INTRAVENOUS | Status: DC
  Administered 2016-02-02 – 2016-02-04 (×3): 25 ug via INTRAVENOUS
  Filled 2016-02-01 (×3): qty 5

## 2016-02-01 MED ORDER — ORAL CARE MOUTH RINSE
15.0000 mL | Freq: Two times a day (BID) | OROMUCOSAL | Status: DC
  Administered 2016-02-02 – 2016-02-03 (×3): 15 mL via OROMUCOSAL

## 2016-02-01 NOTE — ED Notes (Signed)
Pt family notified this RN that pt was "spitting up." This RN to bedside to suction pt mouth. Pt in NAD, breathing even and unlabored. O2 Sats 100%.

## 2016-02-01 NOTE — ED Triage Notes (Signed)
[facility] in [facility] via [facility] with stroke-like symptoms. At 0400 med pass, CNA stated pt was a&ox4 at baseline, but when returning at 0545, pt was altered and had R sided facial droop. Hx of CVA in past with R sided weakness. Pt arrives via [facility] nonverbal, CBG 149, BP 137/56

## 2016-02-01 NOTE — ED Notes (Signed)
Pt taken to EEG  

## 2016-02-01 NOTE — H&P (Signed)
History and Physical    [name]:[id number] DOB: February 09, 1941 DOA: 02/01/2016   PCP: [name], MD   Patient coming from/Resides with: Skilled nursing facility/[facility]  Admission status: Inpatient/telemetry -medically necessary to stay a minimum 2 midnights to rule out impending and/or unexpected changes in physiologic status that may differ from initial evaluation performed in the ER and/or at time of admission. Presents with new large right frontal lobe infarct involving the right frontal operculum in setting of large remote left MCA territory infarct in 1996. Patient will require full stroke evaluation, NPO status until passes swallowing evaluation. She also presented with apparent post stroke seizure requiring IV antiepileptic medications and follow-up EEG. Given the severity of stroke end of life goals of care will likely need to be addressed prior to discharge back to skilled nursing facility.  Chief Complaint: Altered mental status with right gaze deviation  HPI: Mia Calhoun is a 75 y.o. female with medical history significant for prior stroke with persistent right-sided weakness in 1996, chronic kidney disease stage III, chronic diastolic heart failure on diuretics, COPD, diabetes on insulin, iron deficiency anemia, hypertension, dyslipidemia and hypothyroidism. Patient was last seen normal around 4 AM at nursing facility. Upon reevaluation by staff at 5:30 she was noticed to have left arm and leg twitching which lasted for 3 minutes with apparent postictal phase for several minutes. EMS was called and en route to hospital code stroke was initiated due to persistently deviated gaze on the right. While in route patient begin using her left side purposefully. Due to concerns for seizure activity she was given Ativan 1 mg. Upon arrival to ER noncontrast head CT was obtained which showed a subacute right MCA territory infarct large in size. In discussing with the  patient's family at the bedside (patient with chronic aphasia and unable to participate in history) patient does not talk at baseline, she was not on a dysphagia diet and was able to feed herself with her left arm, she was nonambulatory but was able to stand and pivot to get into the chair, she is incontinent of bowel and bladder and wears diapers at the facility.  ED Course:  Vital Signs: BP 116/72 (BP Location: Right Arm)   Pulse 108   Temp 98.7 F (37.1 C) (Axillary)   Resp 18   Ht 5\' 5"  (1.651 m)   Wt 85.4 kg (188 lb 4.4 oz)   SpO2 98%   BMI 31.33 kg/m  CT head without contrast, stroke protocol: Remote large left MCA territory infarct, new large right frontal lobe infarct with a right frontal operculum primary involvement and no hemorrhage PCXR: Not yet read by radiologist, view diffuse interstitial changes in setting of known COPD (I am unable to get the PACS system to pull-up so I can impair to old film) Medications and treatments: Ativan 2 mg IV 1, Vimpat 100 mg IV 1  Review of Systems:  **Limited primarily based on report from nursing staff, EMS and family as documented above in history of present illness   Past Medical History:  Diagnosis Date  . Anemia, unspecified   . 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 CKD (chronic kidney disease) stage 3, GFR 30-59 ml/min   . Congestive heart failure, unspecified   . Depressive disorder, not elsewhere classified   . Edema   . Hemiplegia, unspecified, affecting unspecified side   . 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 Nontoxic multinodular goiter   . Osteoporosis, unspecified   .  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 Unspecified constipation   . Unspecified disorder of kidney and ureter   . Unspecified essential hypertension   . Unspecified hereditary and idiopathic peripheral neuropathy   . Unspecified hypothyroidism   .  Unspecified late effects of cerebrovascular disease   . Unspecified vitamin D deficiency     Past Surgical History:  Procedure Laterality Date  . EYE SURGERY Left 03/22/2009   h/o diabetic retinopathy    Social History   Social History  . Marital status: Single    Spouse name: N/A  . Number of children: N/A  . Years of education: N/A   Occupational History  . Not on file.   Social History Main Topics  . Smoking status: Current Every Day Smoker  . Smokeless tobacco: Never Used  . Alcohol use No  . Drug use: No  . Sexual activity: Not Currently   Other Topics Concern  . Not on file   Social History Narrative  . No narrative on file    Mobility: Essentially bedbound-up to chair with max assist Work history: Disabled   No Known Allergies   Family history reviewed and not pertinent to current admission diagnosis and findings  Prior to Admission medications   Medication Sig Start Date End Date Taking? Authorizing Provider  acetaminophen (TYLENOL) 650 MG CR tablet Take 650 mg by mouth every 6 (six) hours as needed for pain.   Yes Historical Provider, MD  cholecalciferol (VITAMIN D) 1000 UNITS tablet Take 2,000 Units by mouth daily.   Yes Historical Provider, MD  fluticasone (FLONASE) 50 MCG/ACT nasal spray Place 2 sprays into both nostrils 2 (two) times daily.   Yes Historical Provider, MD  furosemide (LASIX) 20 MG tablet Take 20 mg by mouth daily.    Yes Historical Provider, MD  guaifenesin (ROBITUSSIN) 100 MG/5ML syrup Take 10 mL by mouth every 8 hours as needed for cough    Yes Historical Provider, MD  insulin glargine (LANTUS) 100 UNIT/ML injection Inject 40 Units into the skin at bedtime.    Yes Historical Provider, MD  insulin lispro (HUMALOG) 100 UNIT/ML injection Inject 10-12 Units into the skin See admin instructions. 10 units before breakfast and lunch then 12 units before dinner   Yes Historical Provider, MD  iron polysaccharides (NIFEREX) 150 MG capsule Take  150 mg by mouth 2 (two) times daily. Take 1 tablet daily for anemia.   Yes Historical Provider, MD  levothyroxine (SYNTHROID, LEVOTHROID) 50 MCG tablet Take 50 mcg by mouth daily before breakfast. Take 1 tablet daily for thyroid therapy.   Yes Historical Provider, MD  lisinopril (PRINIVIL,ZESTRIL) 2.5 MG tablet Take 2.5 mg by mouth daily.    Yes Historical Provider, MD  metoprolol succinate (TOPROL-XL) 50 MG 24 hr tablet Take 50 mg by mouth daily.    Yes Historical Provider, MD  Multiple Vitamins-Minerals (CERTAVITE/ANTIOXIDANTS) TABS Take 1 tablet by mouth daily.   Yes Historical Provider, MD  polyvinyl alcohol (LIQUIFILM TEARS) 1.4 % ophthalmic solution 1 drop 4 (four) times daily as needed for dry eyes. Wait 3-5 minutes between 2 eye meds   Yes Historical Provider, MD  sennosides-docusate sodium (SENOKOT-S) 8.6-50 MG tablet Take 1 tablet by mouth 2 (two) times daily.    Yes Historical Provider, MD  simvastatin (ZOCOR) 10 MG tablet Take 10 mg by mouth daily.   Yes Historical Provider, MD  spironolactone (ALDACTONE) 25 MG tablet Take 12.5 mg by mouth daily. For HTN,  CHF   Yes Historical Provider, MD    Physical Exam: Vitals:   02/01/16 0830 02/01/16 0845 02/01/16 0855 02/01/16 0912  BP: 109/77 126/70 126/70 116/72  Pulse: 106 105 107 108  Resp: 24 20 (!) 30 18  Temp:      TempSrc:      SpO2: 98% 97% 98% 98%  Weight:      Height:          Constitutional: Altered mentation noted with appropriate respiratory function Eyes: PERRL pinpoint, lids and conjunctivae normal-persistent right eye deviation-does not follow commands unable to test EOMs ENMT: Mucous membranes are dry. Posterior pharynx clear of any exudate or lesions. Neck: normal, supple, no masses, chronic thyromegaly Respiratory: clear to auscultation bilaterally but diminished in bases, no wheezing, no crackles. Normal respiratory effort. No accessory muscle use.  Cardiovascular: Regular rate and rhythm, no murmurs / rubs /  gallops. No extremity edema. 2+ pedal pulses. No carotid bruits.  Abdomen: no tenderness, no masses palpated. No hepatosplenomegaly. Bowel sounds positive.  Musculoskeletal: no clubbing / cyanosis. No joint deformity upper and lower extremities. Good ROM, no contractures. Normal muscle tone.  Skin: no rashes, lesions, ulcers. No induration Neurologic: CN 2-12 unable to be accurately tested secondary to patient's inability to participate although as noted above prominent right gaze deviation. Sensation intact all extremities although uncertain regarding right upper extremity. Unable to accurately test strength given patient inability to participate but did note a strong withdrawal of left leg with Babinski testing and diminish withdrawal right leg with Babinski testing with Babinski's being normal-right foot drop Psychiatric: Opens eyes but uncertain if actually focusing gaze. Unable to test otherwise given altered mentation. Was nonverbal at baseline prior to current acute event   Labs on Admission: I have personally reviewed following labs and imaging studies  CBC:  Recent Labs Lab 02/01/16 0710 02/01/16 0719  WBC 15.0*  --   NEUTROABS 11.6*  --   HGB 12.5 14.3  HCT 38.6 42.0  MCV 72.4*  --   PLT 172  --    Basic Metabolic Panel:  Recent Labs Lab 02/01/16 0710 02/01/16 0719  NA 136 137  K 5.1 5.1  CL 104 104  CO2 19*  --   GLUCOSE 185* 191*  BUN 42* 41*  CREATININE 1.65* 1.70*  CALCIUM 9.0  --    GFR: Estimated Creatinine Clearance: 31.8 mL/min (by C-G formula based on SCr of 1.7 mg/dL (H)). Liver Function Tests:  Recent Labs Lab 02/01/16 0710  AST 16  ALT 10*  ALKPHOS 77  BILITOT 0.5  PROT 7.3  ALBUMIN 3.4*   No results for input(s): LIPASE, AMYLASE in the last 168 hours. No results for input(s): AMMONIA in the last 168 hours. Coagulation Profile:  Recent Labs Lab 02/01/16 0710  INR 1.08   Cardiac Enzymes: No results for input(s): CKTOTAL, CKMB,  CKMBINDEX, TROPONINI in the last 168 hours. BNP (last 3 results) No results for input(s): PROBNP in the last 8760 hours. HbA1C: No results for input(s): HGBA1C in the last 72 hours. CBG:  Recent Labs Lab 02/01/16 0709  GLUCAP 196*   Lipid Profile: No results for input(s): CHOL, HDL, LDLCALC, TRIG, CHOLHDL, LDLDIRECT in the last 72 hours. Thyroid Function Tests: No results for input(s): TSH, T4TOTAL, FREET4, T3FREE, THYROIDAB in the last 72 hours. Anemia Panel: No results for input(s): VITAMINB12, FOLATE, FERRITIN, TIBC, IRON, RETICCTPCT in the last 72 hours. Urine analysis:    Component Value Date/Time   COLORURINE Yellow 11/13/2013  1152   COLORURINE YELLOW 05/19/2008 0327   APPEARANCEUR Hazy 11/13/2013 1152   LABSPEC 1.006 11/13/2013 1152   PHURINE 6.0 11/13/2013 1152   PHURINE 6.0 05/19/2008 0327   GLUCOSEU Negative 11/13/2013 1152   HGBUR Negative 11/13/2013 1152   HGBUR TRACE (A) 05/19/2008 0327   BILIRUBINUR Negative 11/13/2013 1152   KETONESUR Negative 11/13/2013 1152   KETONESUR NEGATIVE 05/19/2008 0327   PROTEINUR Negative 11/13/2013 1152   PROTEINUR NEGATIVE 05/19/2008 0327   UROBILINOGEN 0.2 05/19/2008 0327   NITRITE Negative 11/13/2013 1152   NITRITE POSITIVE (A) 05/19/2008 0327   LEUKOCYTESUR 3+ 11/13/2013 1152   Sepsis Labs: @LABRCNTIP (procalcitonin:4,lacticidven:4) )No results found for this or any previous visit (from the past 240 hour(s)).   Radiological Exams on Admission: Dg Chest Port 1 View  Result Date: 02/01/2016 CLINICAL DATA:  Altered mental status EXAM: PORTABLE CHEST 1 VIEW COMPARISON:  CT chest screening scan of 04/08/2015 and chest x-ray of 05/20/1998 and FINDINGS: The lungs are poorly aerated with volume loss. No definite pneumonia or effusion is seen. Heart size is within normal limits. No acute bony abnormality is seen. There is degenerative change noted in the shoulders. A tubing overlies the right chest. IMPRESSION: Poor inspiration.  No  active infiltrate or effusion. Electronically Signed   By: Dwyane DeePaul  Barry M.D.   On: 02/01/2016 09:07   Ct Head Code Stroke W/o Cm  Result Date: 02/01/2016 CLINICAL DATA:  Code stroke. New onset a aphasia with fixed gaze to the right. Remote infarct with right-sided weakness. EXAM: CT HEAD WITHOUT CONTRAST TECHNIQUE: Contiguous axial images were obtained from the base of the skull through the vertex without intravenous contrast. COMPARISON:  CT head without contrast 05/19/2008. FINDINGS: Brain: The a remote large left MCA territory infarct is again noted. A new large right frontal lobe infarct involves the right frontal operculum. There is some involvement of the lentiform nucleus. No acute hemorrhage is present. There is some effacement of the sulci within the infarct territory on the right. Will layering degeneration is present along the left side the brainstem. The cerebellum is intact. Vascular: Atherosclerotic calcifications are present within the cavernous internal carotid arteries bilaterally without a significant hyperdense vessel. Skull: Occipital craniotomy is again noted. The calvarium is otherwise intact. No focal lytic or blastic lesions are present. Sinuses/Orbits: The left sphenoid sinus is opacified. A polyp or mucous retention cyst is present posteriorly within the left maxillary sinus. Torus palatini are noted. The incisive foramen of the maxilla is expanded. ASPECTS [facility]([place] Stroke Program Early CT Score) - Ganglionic level infarction (caudate, lentiform nuclei, internal capsule, insula, M1-M3 cortex): 5/7 - Supraganglionic infarction (M4-M6 cortex): 2/3 Total score (0-10 with 10 being normal): 7/10 IMPRESSION: 1. Acute/subacute nonhemorrhagic infarct involving the right frontal operculum and lentiform nucleus. 2. Large remote left MCA territory infarct. 3. Left sphenoid sinus opacification. 4. Polyp or mucous retention cyst in the left maxillary sinus 5. ASPECTS is 7/10 These results were called  by telephone at the time of interpretation on 02/01/2016 at 7:33 am to Dr. Amada JupiterKIRKPATRICK, who verbally acknowledged these results. Electronically Signed   By: Marin Robertshristopher  Mattern M.D.   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 -Hemoglobin A1c/lipid panel -Frequent neuro checks -Antiplatelet with aspirin  Active Problems:   Hemiplegia affecting dominant side (RIGHT), post-stroke/Aphasia as late effect of cerebrovascular accident (CVA) -According to family had recovered to apparent weakness of right side but was utilizing left arm to feed self and remained aphasic -Given severity of current stroke may need to involve palliative medicine for goals of care; at time of admission I discussed CODE STATUS with family and they reported they need to discuss and wished her to be a full at this time    New onset seizure  -EMS witnessed tonic-clonic activity involving the left side with apparent postictal phase and recurrent purposeful use of left side thereafter -EEG pending -Continue Vimpat    Chronic kidney disease, stage III (moderate) -Appears volume depleted -Baseline renal function 14/0.6 -Current renal function 41/1.7 -Hold Lasix and Aldactone  -IV fluids    Diabetes mellitus type 2 in obese  -Continue Lantus but at half home dose -Follow CBGs/provide SSI    Chronic diastolic congestive heart failure -Appears euvolemic/dry although  does have likely chronic interstitial changes in the lungs from COPD     Emphysema of lung -No active wheezing     Hypothyroidism -Continue Synthroid but convert to IV dosing while NPO -check TSH    Essential hypertension, benign -Current blood pressure somewhat suboptimal and likely related to find depletion -Hold antihypertensives (lisinopril and Toprol)    Hyperlipidemia LDL goal <100 -Hold preadmission Zocor    Anemia, iron deficiency -Hold preadmission iron -Baseline hemoglobin 12.7 -Current hemoglobin 12.5      DVT prophylaxis: Lovenox Code Status: Full Family Communication: Multiple family members at bedside including husband and daughters Disposition Plan: Anticipate return to skilled nursing facility Consults called: Neurology/Kirkpatrick    Russella Dar ANP-BC [facility] Pager [phone]   If 7PM-7AM, please contact night-coverage [email or web address] Password [email or web address]  02/01/2016, 9:30 AM

## 2016-02-01 NOTE — ED Notes (Signed)
Called 5x's to give report to [facility]

## 2016-02-01 NOTE — Progress Notes (Signed)
EEG Completed; Results Pending  

## 2016-02-01 NOTE — ED Provider Notes (Signed)
[facility]-EMER[facility]ENCY DEPT Provider Note   CSN: [id number] Arrival date & time: 02/01/16  0707     History   Chief Complaint Chief Complaint  Patient presents with  . Code Stroke  . Facial Droop  . Altered Mental Status    HPI Mia SimmondsDorothy A Calhoun is a 75 y.o. female.  75 yo F with hx of prior L sided stroke with R sided paralysis, aphasia.  Here for worsening mental status.  Made a code stroke in the field.  Level V caveat non verbal.    The history is provided by the patient, the nursing home and the EMS personnel.  Altered Mental Status   This is a new problem. The current episode started 1 to 2 hours ago. Associated symptoms include unresponsiveness.    Past Medical History:  Diagnosis Date  . Anemia, unspecified   . 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 CKD (chronic kidney disease) stage 3, GFR 30-59 ml/min   . Congestive heart failure, unspecified   . Depressive disorder, not elsewhere classified   . Edema   . Hemiplegia, unspecified, affecting unspecified side   . 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 Nontoxic multinodular goiter   . Osteoporosis, unspecified   . 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 Unspecified constipation   . Unspecified disorder of kidney and ureter   . Unspecified essential hypertension   . Unspecified hereditary and idiopathic peripheral neuropathy   . Unspecified hypothyroidism   . Unspecified late effects of cerebrovascular disease   . Unspecified vitamin D deficiency     Patient Active Problem List   Diagnosis Date Noted  . Acute cerebrovascular accident (CVA) (HCC) 02/01/2016  . History of CVA with residual deficit 12/14/2015  . Aphasia as late effect of cerebrovascular accident (CVA) 12/14/2015  . Chronic constipation 08/12/2015  . CKD (chronic kidney disease) stage 3, GFR 30-59 ml/min 05/17/2015  . 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 Emphysema of lung (HCC) 10/20/2014  . 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 Chronic diastolic heart failure (HCC) 09/15/2014  . CKD (chronic kidney disease) 09/15/2014  . Obstructive chronic bronchitis without exacerbation (HCC) 05/24/2014  . Type 2 diabetes mellitus with severe nonproliferative diabetic retinopathy and without macular edema (HCC) 05/24/2014  . Chronic diastolic congestive heart failure (HCC) 03/26/2014  . Anemia, iron deficiency 03/26/2014  . Cataract 02/16/2014  . Insomnia 02/16/2014  . Gastroesophageal reflux disease without esophagitis 02/16/2014  . Type 2 diabetes mellitus with diabetic chronic kidney disease (HCC) 02/16/2014  . Hemiplegia affecting dominant side, post-stroke (HCC) 10/23/2013  . Tobacco use disorder 10/23/2013  . Type II or unspecified type diabetes mellitus with renal manifestations, not stated as uncontrolled(250.40) 05/30/2013  . Nontoxic multinodular goiter 05/30/2013  . Severe nonproliferative diabetic retinopathy(362.06) 04/30/2013  . Chronic airway obstruction, not elsewhere classified 04/30/2013  . Peripheral angiopathy in diseases classified elsewhere (HCC) 04/30/2013  . Type II or unspecified type diabetes mellitus with peripheral circulatory disorders, not stated as uncontrolled(250.70) 04/30/2013  . Hypothyroidism 03/28/2013  . Essential hypertension, benign 03/28/2013  . GERD (gastroesophageal reflux disease) 03/28/2013  . Hyperlipidemia LDL goal <100 03/28/2013  . DM type 2, uncontrolled, with renal complications (HCC) 03/28/2013  . Anemia of chronic disease 12/16/2012  . Diastolic CHF (HCC) 12/16/2012  . Other iron deficiency anemias 10/04/2012  . Senile osteoporosis 10/04/2012  . Unspecified diastolic heart failure 10/04/2012  . Benign hypertensive heart and kidney disease with heart failure  and with chronic kidney disease stage I through stage IV, or unspecified(404.11) 10/04/2012  . Chronic  kidney disease, stage III (moderate) 10/04/2012  . Diabetes with renal manifestations(250.4) 10/04/2012    Past Surgical History:  Procedure Laterality Date  . EYE SURGERY Left 03/22/2009   h/o diabetic retinopathy    OB History    No data available       Home Medications    Prior to Admission medications   Medication Sig Start Date End Date Taking? Authorizing Provider  acetaminophen (TYLENOL) 650 MG CR tablet Take 650 mg by mouth every 6 (six) hours as needed for pain.   Yes Historical Provider, MD  cholecalciferol (VITAMIN D) 1000 UNITS tablet Take 2,000 Units by mouth daily.   Yes Historical Provider, MD  fluticasone (FLONASE) 50 MCG/ACT nasal spray Place 2 sprays into both nostrils 2 (two) times daily.   Yes Historical Provider, MD  furosemide (LASIX) 20 MG tablet Take 20 mg by mouth daily.    Yes Historical Provider, MD  guaifenesin (ROBITUSSIN) 100 MG/5ML syrup Take 10 mL by mouth every 8 hours as needed for cough    Yes Historical Provider, MD  insulin glargine (LANTUS) 100 UNIT/ML injection Inject 40 Units into the skin at bedtime.    Yes Historical Provider, MD  insulin lispro (HUMALOG) 100 UNIT/ML injection Inject 10-12 Units into the skin See admin instructions. 10 units before breakfast and lunch then 12 units before dinner   Yes Historical Provider, MD  iron polysaccharides (NIFEREX) 150 MG capsule Take 150 mg by mouth 2 (two) times daily. Take 1 tablet daily for anemia.   Yes Historical Provider, MD  levothyroxine (SYNTHROID, LEVOTHROID) 50 MCG tablet Take 50 mcg by mouth daily before breakfast. Take 1 tablet daily for thyroid therapy.   Yes Historical Provider, MD  lisinopril (PRINIVIL,ZESTRIL) 2.5 MG tablet Take 2.5 mg by mouth daily.    Yes Historical Provider, MD  metoprolol succinate (TOPROL-XL) 50 MG 24 hr tablet Take 50 mg by mouth daily.    Yes Historical Provider, MD  Multiple Vitamins-Minerals (CERTAVITE/ANTIOXIDANTS) TABS Take 1 tablet by mouth daily.   Yes  Historical Provider, MD  polyvinyl alcohol (LIQUIFILM TEARS) 1.4 % ophthalmic solution 1 drop 4 (four) times daily as needed for dry eyes. Wait 3-5 minutes between 2 eye meds   Yes Historical Provider, MD  sennosides-docusate sodium (SENOKOT-S) 8.6-50 MG tablet Take 1 tablet by mouth 2 (two) times daily.    Yes Historical Provider, MD  simvastatin (ZOCOR) 10 MG tablet Take 10 mg by mouth daily.   Yes Historical Provider, MD  spironolactone (ALDACTONE) 25 MG tablet Take 12.5 mg by mouth daily. For HTN, CHF   Yes Historical Provider, MD    Family History No family history on file.  Social History Social History  Substance Use Topics  . Smoking status: Current Every Day Smoker  . Smokeless tobacco: Never Used  . Alcohol use No     Allergies   Patient has no known allergies.   Review of Systems Review of Systems  Unable to perform ROS: Patient nonverbal     Physical Exam Updated Vital Signs BP 126/70 (BP Location: Right Arm)   Pulse 107   Temp 98.7 F (37.1 C) (Axillary)   Resp (!) 30   Ht 5\' 5"  (1.651 m)   Wt 188 lb 4.4 oz (85.4 kg)   SpO2 98%   BMI 31.33 kg/m   Physical Exam  Constitutional: She appears well-developed and well-nourished. No distress.  HENT:  Head: Normocephalic and atraumatic.  Eyes: EOM are normal. Pupils are equal, round, and reactive to light.  Neck: Normal range of motion. Neck supple.  Cardiovascular: Normal rate and regular rhythm.  Exam reveals no gallop and no friction rub.   No murmur heard. Pulmonary/Chest: Effort normal. She has no wheezes. She has no rales.  Abdominal: Soft. She exhibits no distension. There is no tenderness.  Musculoskeletal: She exhibits no edema or tenderness.  Neurological: She is alert.  Right sided gaze.  R sided paralysis.  Not following commands.  Non verbal  Skin: Skin is warm and dry. She is not diaphoretic.  Psychiatric: She has a normal mood and affect. Her behavior is normal.  Nursing note and vitals  reviewed.    ED Treatments / Results  Labs (all labs ordered are listed, but only abnormal results are displayed) Labs Reviewed  CBC - Abnormal; Notable for the following:       Result Value   WBC 15.0 (*)    RBC 5.33 (*)    MCV 72.4 (*)    MCH 23.5 (*)    All other components within normal limits  DIFFERENTIAL - Abnormal; Notable for the following:    Neutro Abs 11.6 (*)    All other components within normal limits  COMPREHENSIVE METABOLIC PANEL - Abnormal; Notable for the following:    CO2 19 (*)    Glucose, Bld 185 (*)    BUN 42 (*)    Creatinine, Ser 1.65 (*)    Albumin 3.4 (*)    ALT 10 (*)    GFR calc non Af Amer 30 (*)    GFR calc Af Amer 34 (*)    All other components within normal limits  CBG MONITORING, ED - Abnormal; Notable for the following:    Glucose-Capillary 196 (*)    All other components within normal limits  I-STAT CHEM 8, ED - Abnormal; Notable for the following:    BUN 41 (*)    Creatinine, Ser 1.70 (*)    Glucose, Bld 191 (*)    Calcium, Ion 1.10 (*)    All other components within normal limits  URINE CULTURE  PROTIME-INR  APTT  URINALYSIS, ROUTINE W REFLEX MICROSCOPIC (NOT AT [facility])  I-STAT TROPOININ, ED    EKG  EKG Interpretation None       Radiology Ct Head Code Stroke W/o Cm  Result Date: 02/01/2016 CLINICAL DATA:  Code stroke. New onset a aphasia with fixed gaze to the right. Remote infarct with right-sided weakness. EXAM: CT HEAD WITHOUT CONTRAST TECHNIQUE: Contiguous axial images were obtained from the base of the skull through the vertex without intravenous contrast. COMPARISON:  CT head without contrast 05/19/2008. FINDINGS: Brain: The a remote large left MCA territory infarct is again noted. A new large right frontal lobe infarct involves the right frontal operculum. There is some involvement of the lentiform nucleus. No acute hemorrhage is present. There is some effacement of the sulci within the infarct territory on the right.  Will layering degeneration is present along the left side the brainstem. The cerebellum is intact. Vascular: Atherosclerotic calcifications are present within the cavernous internal carotid arteries bilaterally without a significant hyperdense vessel. Skull: Occipital craniotomy is again noted. The calvarium is otherwise intact. No focal lytic or blastic lesions are present. Sinuses/Orbits: The left sphenoid sinus is opacified. A polyp or mucous retention cyst is present posteriorly within the left maxillary sinus. Torus palatini are noted. The incisive foramen of the  maxilla is expanded. ASPECTS [facility] Stroke Program Early CT Score) - Ganglionic level infarction (caudate, lentiform nuclei, internal capsule, insula, M1-M3 cortex): 5/7 - Supraganglionic infarction (M4-M6 cortex): 2/3 Total score (0-10 with 10 being normal): 7/10 IMPRESSION: 1. Acute/subacute nonhemorrhagic infarct involving the right frontal operculum and lentiform nucleus. 2. Large remote left MCA territory infarct. 3. Left sphenoid sinus opacification. 4. Polyp or mucous retention cyst in the left maxillary sinus 5. ASPECTS is 7/10 These results were called by telephone at the time of interpretation on 02/01/2016 at 7:33 am to Dr. Amada Jupiter, who verbally acknowledged these results. Electronically Signed   By: Marin Roberts M.D.   On: 02/01/2016 07:39    Procedures Procedures (including critical care time)  Medications Ordered in ED Medications  lacosamide (VIMPAT) 100 mg in sodium chloride 0.9 % 25 mL IVPB (100 mg Intravenous Given 02/01/16 0857)  LORazepam (ATIVAN) 2 MG/ML injection (2 mg  Given 02/01/16 1610)     Initial Impression / Assessment and Plan / ED Course  I have reviewed the triage vital signs and the nursing notes.  Pertinent labs & imaging results that were available during my care of the patient were reviewed by me and considered in my medical decision making (see chart for details).  Clinical Course      75 yo F with AMS.  Hx of R sided paralysis and aphasia from prior stroke. Made a code stroke in the field.  Appears to be protecting her airway, sent to scanner.   The patient has new right MCA stroke. Further history obtained from nursing home patient had a witnessed seizure tonic-clonic activity. Patient was last seen normal at 4 AM. However due to a complicated history neurology does not feel that TPA is indicated at this time. We'll load on antiepileptics. Admit.   The patients results and plan were reviewed and discussed.   Any x-rays performed were independently reviewed by myself.   Differential diagnosis were considered with the presenting HPI.  Medications  lacosamide (VIMPAT) 100 mg in sodium chloride 0.9 % 25 mL IVPB (100 mg Intravenous Given 02/01/16 0857)  LORazepam (ATIVAN) 2 MG/ML injection (2 mg  Given 02/01/16 0728)    Vitals:   02/01/16 0800 02/01/16 0815 02/01/16 0830 02/01/16 0855  BP: 125/56 142/62 109/77 126/70  Pulse: 102 106 106 107  Resp: 17 20 24  (!) 30  Temp:      TempSrc:      SpO2: 94% 97% 98% 98%  Weight:      Height:        Final diagnoses:  Acute ischemic right MCA stroke (HCC)    Admission/ observation were discussed with the admitting physician, patient and/or family and they are comfortable with the plan.   Final Clinical Impressions(s) / ED Diagnoses   Final diagnoses:  Acute ischemic right MCA stroke [facility])    New Prescriptions New Prescriptions   No medications on file     Melene Plan, DO 02/01/16 9604

## 2016-02-01 NOTE — Progress Notes (Signed)
Code stroke called at 0656, [facility] 0[facility].  As per EMS LSN 0400, staff found her nonverbal and right gaze.  Dr. Amada JupiterKirkpatrick spoke with staff, and was told patient was twitching when she was found.  1 mg IV ativan was given.

## 2016-02-01 NOTE — Procedures (Signed)
HPI:  75 y/o with L arm twitching, found to have R MCA infarct  TECHNICAL SUMMARY:  A multichannel referential and bipolar montage EEG using the standard international 10-20 system was performed on the patient described as briefly awake, but mostly drowsy and asleep.  During the brief portion of the tracing in which the patient is awake, there is a 6-7 Hz occipital dominant rhythm.  This is nonreactive to eye opening and closing procedures.  3 Hz activity can be seen in the frontal regions.  Low voltage fast activity was distributed symmetrically.  ACTIVATION:  Stepwise photic stimulation and hyperventilation were not performed  EPILEPTIFORM ACTIVITY:  There were no spikes, sharp waves or paroxysmal activity.  SLEEP:  Most of the recording is spent in stage I and stage II sleep architecture  IMPRESSION:  This EEG demonstrated no focal, hemispheric, or lateralizing features.  There was no epileptiform activity recorded.  There was slowing of electrocerebral activity which can be seen in a wide variety of encephalopathic states including those of a toxic, metabolic, or degenerative nature.  The patient, however, was very drowsy during the recording and most of the recording was spent in sleep.  Correlate clinically.

## 2016-02-01 NOTE — Consult Note (Signed)
Neurology Consultation Reason for Consult: right gaze deviation Referring Physician: Floyd, Calhoun  CC: Right gaze deviation  HistorAdela Calhoun is obtained from:Facility  HPI: Mia Calhoun is a 75 y.o. female who presents with altered mental status. After discussing with the facility, she helps her self turn around 4 AM, but it is unclear to me if she was truly in her normal state at that time based on the history. Then around 5:30. She began having left arm and leg twitching which lasted for approximately 3 minutes. During this time and immediately afterwards, she was completely unresponsive. En route to the hospital a code stroke was called. She had persistently deviated gaze to the right, but began using her left side purposefully. Initially with a gaze deviation,  There was concern for persistent  Seizure and she was given Ativan 1mg   On arrival, head CT was obtained which shows a subacute right MCA territory infarct.   LKW: Unclear tpa given?: no, Unclear time of onset    ROS: Unable to obtain due to altered mental status.   Past Medical History:  Diagnosis Date  . Anemia, unspecified   . 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 CKD (chronic kidney disease) stage 3, GFR 30-59 ml/min   . Congestive heart failure, unspecified   . Depressive disorder, not elsewhere classified   . Edema   . Hemiplegia, unspecified, affecting unspecified side   . 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 Nontoxic multinodular goiter   . Osteoporosis, unspecified   . 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 Unspecified constipation   . Unspecified disorder of kidney and ureter   . Unspecified essential hypertension   . Unspecified hereditary and idiopathic peripheral neuropathy   . Unspecified hypothyroidism   . Unspecified late effects of cerebrovascular disease   . Unspecified vitamin Calhoun deficiency       Family history: Unable to obtain due to altered mental status  Social History:  reports that she has been smoking.  She has never used smokeless tobacco. She reports that she does not drink alcohol or use drugs.   Exam: Current vital signs: BP 129/86   Pulse 104   Temp 98.7 F (37.1 C) (Axillary)   Resp 15   Ht 5\' 5"  (1.651 m)   Wt 85.4 kg (188 lb 4.4 oz)   SpO2 97%   BMI 31.33 kg/m  Vital signs in last 24 hours: Temp:  [98.7 F (37.1 C)] 98.7 F (37.1 C) (12/05 0731) Pulse Rate:  [101-104] 104 (12/05 0745) Resp:  [15-22] 15 (12/05 0745) BP: (113-129)/(63-86) 129/86 (12/05 0745) SpO2:  [96 %-97 %] 97 % (12/05 0745) Weight:  [85.4 kg (188 lb 4.4 oz)] 85.4 kg (188 lb 4.4 oz) (12/05 0729)   Physical Exam  Constitutional: Appears well-developed and well-nourished.  Psych: Affect appropriate to situation Eyes: No scleral injection HENT: No OP obstrucion Head: Normocephalic.  Cardiovascular: Normal rate and regular rhythm.  Respiratory: Effort normal and breath sounds normal to anterior ascultation GI: Soft.  No distension. There is no tenderness.  Skin: WDI  Neuro: Mental Status: She has eyes open, she does not follow commands, she does have purposeful movements with the left side. Cranial Nerves: II: Blinks to threat from the left but not right Pupils are equal, round, and reactive to light.   III,IV, VI: Right gaze preference, does not cross midline to the left V: Facial sensation is symmetric to temperature VII: Facial movement  is Notable for right facial weakness VIII: hearing is intact to voice X: Uvula elevates symmetrically XI: Shoulder shrug is symmetric. XII: tongue is midline without atrophy or fasciculations.  Motor: She has a spastic hemiparesis on the right, she is able to hold her left arm against gravity well, she will keep her left leg up for a few seconds but lets fall to the bed, unclear if due to lack of understanding or  weakness Sensory: Sensation is symmetric to light touch and temperature in the arms and legs. Cerebellar: FNF and HKS are intact bilaterally  I have reviewed labs in epic and the results pertinent to this consultation are: Cr 1.7  I have reviewed the images obtained:CT head - subacute R MCA territory infarct.   Impression: 75 yo F with new right MCA territory infarct. From description, it also sounds like she did have a seizure this morning. With strokes now bilaterally, I am concerned this will be a fairly devastating insult, but will need to see how she does as she does not have much left sided weakness. She also appears to be in AKI.   Recommendations: 1. HgbA1c, fasting lipid panel 2. 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 Frequent neuro checks 4. Echocardiogram 5. Carotid dopplers 6. Prophylactic therapy-Antiplatelet med: Aspirin - dose 325mg  PO or 300mg  PR 7. Risk factor modification 8. Telemetry monitoring 9. PT consult, OT consult, Speech consult 10. Start vimpat 100mg  BID 11. please page stroke NP  Or  PA  Or MD  M-F from 8am -4 pm starting 10/6 as this patient will be followed by the stroke team at this point.   You can look them up on [email or web address]      Ritta SlotMcNeill Shawnie Nicole, MD [facility] [phone]  If 7pm- 7am, please page neurology on call as listed in AMION.

## 2016-02-02 ENCOUNTER — Inpatient Hospital Stay (HOSPITAL_COMMUNITY): Payer: Medicare Other

## 2016-02-02 ENCOUNTER — Encounter (HOSPITAL_COMMUNITY): Payer: Medicare Other

## 2016-02-02 DIAGNOSIS — I6789 Other cerebrovascular disease: Secondary | ICD-10-CM

## 2016-02-02 DIAGNOSIS — I63511 Cerebral infarction due to unspecified occlusion or stenosis of right middle cerebral artery: Secondary | ICD-10-CM

## 2016-02-02 LAB — URINE CULTURE: CULTURE: NO GROWTH

## 2016-02-02 LAB — GLUCOSE, CAPILLARY
GLUCOSE-CAPILLARY: 106 mg/dL — AB (ref 65–99)
GLUCOSE-CAPILLARY: 112 mg/dL — AB (ref 65–99)
GLUCOSE-CAPILLARY: 124 mg/dL — AB (ref 65–99)
GLUCOSE-CAPILLARY: 138 mg/dL — AB (ref 65–99)
Glucose-Capillary: 93 mg/dL (ref 65–99)

## 2016-02-02 LAB — LIPID PANEL
CHOLESTEROL: 147 mg/dL (ref 0–200)
HDL: 30 mg/dL — ABNORMAL LOW (ref >40)
LDL Cholesterol: 98 mg/dL (ref 0–99)
TRIGLYCERIDES: 95 mg/dL (ref <150)
Total CHOL/HDL Ratio: 4.9 RATIO
VLDL: 19 mg/dL (ref 0–40)

## 2016-02-02 LAB — ECHOCARDIOGRAM COMPLETE
HEIGHTINCHES: 65 in
Weight: 3012.37 oz

## 2016-02-02 MED ORDER — DEXTROSE 5 % IV SOLN
1.0000 g | INTRAVENOUS | Status: DC
  Administered 2016-02-02 – 2016-02-04 (×3): 1 g via INTRAVENOUS
  Filled 2016-02-02 (×4): qty 1

## 2016-02-02 MED ORDER — METRONIDAZOLE IN NACL 5-0.79 MG/ML-% IV SOLN
500.0000 mg | Freq: Three times a day (TID) | INTRAVENOUS | Status: DC
  Administered 2016-02-02 – 2016-02-04 (×6): 500 mg via INTRAVENOUS
  Filled 2016-02-02 (×6): qty 100

## 2016-02-02 MED ORDER — VANCOMYCIN HCL 10 G IV SOLR
1250.0000 mg | INTRAVENOUS | Status: DC
  Administered 2016-02-02 – 2016-02-04 (×3): 1250 mg via INTRAVENOUS
  Filled 2016-02-02 (×3): qty 1250

## 2016-02-02 NOTE — Progress Notes (Signed)
PROGRESS NOTE    Mia Calhoun  ZOX:[id number] DOB: 17-Mar-1940 DOA: 02/01/2016 PCP: [name], MD    Brief Narrative: HPI: Mia Calhoun is a 75 y.o. female with medical history significant for prior stroke with persistent right-sided weakness in 1996, chronic kidney disease stage III, chronic diastolic heart failure on diuretics, COPD, diabetes on insulin, iron deficiency anemia, hypertension, dyslipidemia and hypothyroidism. Patient was last seen normal around 4 AM at nursing facility. Upon reevaluation by staff at 5:30 she was noticed to have left arm and leg twitching which lasted for 3 minutes with apparent postictal phase for several minutes. EMS was called and en route to hospital code stroke was initiated due to persistently deviated gaze on the right. While in route patient begin using her left side purposefully. Due to concerns for seizure activity she was given Ativan 1 mg. Upon arrival to ER noncontrast head CT was obtained which showed a subacute right MCA territory infarct large in size. In discussing with the patient's family at the bedside (patient with chronic aphasia and unable to participate in history) patient does not talk at baseline, she was not on a dysphagia diet and was able to feed herself with her left arm, she was nonambulatory but was able to stand and pivot to get into the chair, she is incontinent of bowel and bladder and wears diapers at the facility.   Assessment & Plan:   Principal Problem:   Acute cerebrovascular accident (CVA) (HCC) Active Problems:   Chronic kidney disease, stage III (moderate)   Diabetes mellitus type 2 in obese (HCC)   Hypothyroidism   Essential hypertension, benign   Hyperlipidemia LDL goal <100   Hemiplegia affecting dominant side, post-stroke (HCC)   Chronic diastolic congestive heart failure (HCC)   Anemia, iron deficiency   Emphysema of lung (HCC)   Aphasia as late effect of cerebrovascular accident (CVA)   New onset  seizure (HCC)   1-Acute stroke, frontal lentiform nuclei.  Neurology consulted and following.  Patient didn't received TPA, unclear time last seen normal.  Patient at baseline aphasic, right side hemiparesis, rigidity. Discussed with daughter and son at bedside. [name] would not wanted to be resuscitated or been on life support.  Started on Vimpat to treat for seizure.  Will consult palliative care for goals of care and for family support.  ECHO pending.  Swallow evaluation.  Aspirin per rectum.   2-PNA, Health care vs aspiration.  Concern for aspiration.  Started IV vancomycin and cefepime. Will add flagyl to cover for oral flora.  Follow blood culture.   3-HTN; permissive  HNT in setting of Acute stroke.  4-DM; hold long acting insulin due to NPO. SSI.  5-AKI; IV fluids,   DVT prophylaxis: lovenox.  Code Status: DNR> discussed with family.  Family Communication: care discussed with daughter and son who were at bedside.  Disposition Plan: to be determine   Consultants:   Neurology  Palliative care team for goals of care.      Procedures:    Antimicrobials:   Vancomycin, cefepime      Subjective: Patient aphasic, open eyes, has difficulty with cough and mouth secretions.    Objective: Vitals:   02/02/16 0001 02/02/16 0144 02/02/16 0338 02/02/16 0756  BP: 113/72 123/68 (!) 142/70 (!) 159/55  Pulse: (!) 113 (!) 108 (!) 103 (!) 103  Resp: 18 18 18 16   Temp: 99.6 F (37.6 C) 100.3 F (37.9 C) 99.2 F (37.3 C) 98.6 F (37 C)  TempSrc: Oral Oral Oral   SpO2: 100% 100% 100% 99%  Weight:      Height:        Intake/Output Summary (Last 24 hours) at 02/02/16 1012 Last data filed at 02/02/16 16100629  Gross per 24 hour  Intake          1121.25 ml  Output                0 ml  Net          1121.25 ml   Filed Weights   02/01/16 0700 02/01/16 0729  Weight: 85.4 kg (188 lb 4.4 oz) 85.4 kg (188 lb 4.4 oz)    Examination:  General exam: Appears calm and  comfortable  Respiratory system: Clear to auscultation. Respiratory effort normal. Cardiovascular system: S1 & S2 heard, RRR. No JVD, murmurs, rubs, gallops or clicks. No pedal edema. Gastrointestinal system: Abdomen is nondistended, soft and nontender. No organomegaly or masses felt. Normal bowel sounds heard. Central nervous system: Alert , aphasic, coughing and difficulty managing oral secretions, right side with rigidity, left side with weakness.  Extremities: no edema.  Skin: No rashes, lesions or ulcers Psychiatry: unable to evaluate.     Data Reviewed: I have personally reviewed following labs and imaging studies  CBC:  Recent Labs Lab 02/01/16 0710 02/01/16 0719  WBC 15.0*  --   NEUTROABS 11.6*  --   HGB 12.5 14.3  HCT 38.6 42.0  MCV 72.4*  --   PLT 172  --    Basic Metabolic Panel:  Recent Labs Lab 02/01/16 0710 02/01/16 0719  NA 136 137  K 5.1 5.1  CL 104 104  CO2 19*  --   GLUCOSE 185* 191*  BUN 42* 41*  CREATININE 1.65* 1.70*  CALCIUM 9.0  --    GFR: Estimated Creatinine Clearance: 31.8 mL/min (by C-G formula based on SCr of 1.7 mg/dL (H)). Liver Function Tests:  Recent Labs Lab 02/01/16 0710  AST 16  ALT 10*  ALKPHOS 77  BILITOT 0.5  PROT 7.3  ALBUMIN 3.4*   No results for input(s): LIPASE, AMYLASE in the last 168 hours. No results for input(s): AMMONIA in the last 168 hours. Coagulation Profile:  Recent Labs Lab 02/01/16 0710  INR 1.08   Cardiac Enzymes: No results for input(s): CKTOTAL, CKMB, CKMBINDEX, TROPONINI in the last 168 hours. BNP (last 3 results) No results for input(s): PROBNP in the last 8760 hours. HbA1C: No results for input(s): HGBA1C in the last 72 hours. CBG:  Recent Labs Lab 02/01/16 1645 02/01/16 2038 02/01/16 2315 02/02/16 0336 02/02/16 0917  GLUCAP 194* 204* 164* 93 112*   Lipid Profile:  Recent Labs  02/02/16 0611  CHOL 147  HDL 30*  LDLCALC 98  TRIG 95  CHOLHDL 4.9   Thyroid Function  Tests:  Recent Labs  02/01/16 1642  TSH 0.644   Anemia Panel: No results for input(s): VITAMINB12, FOLATE, FERRITIN, TIBC, IRON, RETICCTPCT in the last 72 hours. Sepsis Labs: No results for input(s): PROCALCITON, LATICACIDVEN in the last 168 hours.  Recent Results (from the past 240 hour(s))  MRSA PCR Screening     Status: None   Collection Time: 02/01/16  4:21 PM  Result Value Ref Range Status   MRSA by PCR NEGATIVE NEGATIVE Final    Comment:        The GeneXpert MRSA Assay (FDA approved for NASAL specimens only), is one component of a comprehensive MRSA colonization surveillance program. It is not intended  to diagnose MRSA infection nor to guide or monitor treatment for MRSA infections.          Radiology Studies: Dg Chest Port 1 View  Result Date: 02/01/2016 CLINICAL DATA:  Altered mental status EXAM: PORTABLE CHEST 1 VIEW COMPARISON:  CT chest screening scan of 04/08/2015 and chest x-ray of 05/20/1998 and FINDINGS: The lungs are poorly aerated with volume loss. No definite pneumonia or effusion is seen. Heart size is within normal limits. No acute bony abnormality is seen. There is degenerative change noted in the shoulders. A tubing overlies the right chest. IMPRESSION: Poor inspiration.  No active infiltrate or effusion. Electronically Signed   By: Dwyane DeePaul  Barry M.D.   On: 02/01/2016 09:07   Ct Head Code Stroke W/o Cm  Result Date: 02/01/2016 CLINICAL DATA:  Code stroke. New onset a aphasia with fixed gaze to the right. Remote infarct with right-sided weakness. EXAM: CT HEAD WITHOUT CONTRAST TECHNIQUE: Contiguous axial images were obtained from the base of the skull through the vertex without intravenous contrast. COMPARISON:  CT head without contrast 05/19/2008. FINDINGS: Brain: The a remote large left MCA territory infarct is again noted. A new large right frontal lobe infarct involves the right frontal operculum. There is some involvement of the lentiform nucleus. No  acute hemorrhage is present. There is some effacement of the sulci within the infarct territory on the right. Will layering degeneration is present along the left side the brainstem. The cerebellum is intact. Vascular: Atherosclerotic calcifications are present within the cavernous internal carotid arteries bilaterally without a significant hyperdense vessel. Skull: Occipital craniotomy is again noted. The calvarium is otherwise intact. No focal lytic or blastic lesions are present. Sinuses/Orbits: The left sphenoid sinus is opacified. A polyp or mucous retention cyst is present posteriorly within the left maxillary sinus. Torus palatini are noted. The incisive foramen of the maxilla is expanded. ASPECTS [facility]([place] Stroke Program Early CT Score) - Ganglionic level infarction (caudate, lentiform nuclei, internal capsule, insula, M1-M3 cortex): 5/7 - Supraganglionic infarction (M4-M6 cortex): 2/3 Total score (0-10 with 10 being normal): 7/10 IMPRESSION: 1. Acute/subacute nonhemorrhagic infarct involving the right frontal operculum and lentiform nucleus. 2. Large remote left MCA territory infarct. 3. Left sphenoid sinus opacification. 4. Polyp or mucous retention cyst in the left maxillary sinus 5. ASPECTS is 7/10 These results were called by telephone at the time of interpretation on 02/01/2016 at 7:33 am to Dr. Amada JupiterKIRKPATRICK, who verbally acknowledged these results. Electronically Signed   By: Marin Robertshristopher  Mattern M.D.   On: 02/01/2016 07:39        Scheduled Meds: .  stroke: mapping our early stages of recovery book   Does not apply Once  . aspirin  300 mg Rectal Daily   Or  . aspirin  325 mg Oral Daily  . chlorhexidine  15 mL Mouth Rinse BID  . enoxaparin (LOVENOX) injection  30 mg Subcutaneous Q24H  . insulin aspart  0-9 Units Subcutaneous Q4H  . insulin glargine  20 Units Subcutaneous QHS  . lacosamide (VIMPAT) IV  100 mg Intravenous Q12H  . levothyroxine  25 mcg Intravenous QAC breakfast  . mouth  rinse  15 mL Mouth Rinse q12n4p   Continuous Infusions: . sodium chloride 75 mL/hr at 02/01/16 2051     LOS: 1 day    Time spent: 35 minutes.     Alba Coryegalado, Chaise Passarella A, MD [facility] Pager [phone]  If 7PM-7AM, please contact night-coverage [email or web address] Password [email or web address] 02/02/2016, 10:12 AM

## 2016-02-02 NOTE — Evaluation (Signed)
Occupational Therapy Evaluation Patient Details Name: Mia Calhoun MRN: [id number] DOB: 02-May-1940 Today's Date: 02/02/2016    History of Present Illness 75 y/o female who presented to ED from [facility]. She began having left arm and leg twitching which lasted for approximately 3 minutes. During this time and immediately afterwards, she was completely unresponsive. En route to the hospital a code stroke was called. She had persistently deviated gaze to the right, but began using her left side purposefully. Initially with a gaze deviation,  There was concern for persistent  Seizure. CT showed subacute R MCA infarct.   Clinical Impression   Per daughter, pt required assist with ADL PTA but was able to assist minimally with basic transfers and self feed with use of L hand. Currently pt total assist +2 for bed mobility and ADL. Pt presenting with impaired cognition, decreased L UE ROM/strength/sensation, unable to follow one step commands, lethargy, poor sitting balance impacting her independence and safety with ADL and functional mobility. Recommending return to SNF upon d/c. Pt would benefit from continued skilled OT to address established goals.    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 Pt  lethargic during eval requiring verbal and tactile stimulus to maintain alertness.  Transfers Overall transfer level: Needs assistance Equipment used: 2 person hand held assist Transfers: Sit to/from Stand Sit to Stand: +2 physical assistance;Total assist         General transfer comment: Attempted to stand x 2 reps from EOB with total assist of 2 and no active motor initiation by patient    Balance Overall balance assessment: Needs assistance Sitting-balance support: Single extremity supported;No upper extremity supported Sitting balance-Leahy Scale: Zero Sitting balance - Comments: Pt requires total A to maintain sitting balance EOB x 5 min. Hand over hand assist to place L hand on rail to attempt to stabiilze Postural control: Posterior lean   Standing balance-Leahy Scale: Zero Standing balance comment: +2 to attempt to get into standing but unable to achieve full upright postion                            ADL Overall ADL's : Needs assistance/impaired                                       General ADL Comments: Pt total assist for ADL and mobility at this time.     Vision Additional Comments: Difficult to assess due to impaired cognition/communication.   Perception     Praxis      Pertinent Vitals/Pain Pain Assessment:  (unable to rate. did not appear to be in pain with movement)     Hand Dominance Left   Extremity/Trunk Assessment Upper Extremity Assessment Upper Extremity Assessment: RUE deficits/detail;LUE  deficits/detail RUE Deficits / Details: chronic R spastic hemi, no active movement noted. LUE Deficits / Details: minimal spontaneous active movement noted, no purposeful movement noted.   Lower Extremity Assessment Lower Extremity Assessment: Defer to PT evaluation RLE Deficits / Details: chronic R hemiparesis from prior CVA. No active movement noted during evaluation. Was able to weightbear PTA per daughter report RLE  Sensation:  (not able to state but likely impaired) RLE Coordination: decreased gross motor LLE Deficits / Details: minimal movement noted with mobility; activated hip adduction in supine LLE Sensation:  (not able to state but likely impaired) LLE Coordination: decreased gross motor   Cervical / Trunk Assessment Cervical / Trunk Assessment: Kyphotic   Communication Communication Communication: Receptive difficulties;Expressive difficulties   Cognition Arousal/Alertness: Lethargic Behavior During Therapy: Flat affect Overall Cognitive Status: Impaired/Different from baseline                 General Comments: Pt nonverbal PTA but was following commands and able to communicate. Now, pt difficult to arouse and not following one step commands.   General Comments       Exercises       Shoulder Instructions      Home Living Family/patient expects to be discharged to:: Skilled nursing facility                                 Additional Comments: Has been in SNF since 2797' per daughter report      Prior Functioning/Environment Level of Independence: Needs assistance  Gait / Transfers Assistance Needed: Daughter reports patient was able to get around in her w/c independently. She was nonambulatory but could stand with assistance from staff and SNF. ADL's / Homemaking Assistance Needed: Per daughter, pt required assist with bathing and dressing PTA but was able to assist minimally. Pt was able to self feed with use of L hand. Communication / Swallowing Assistance Needed: Daughter reports pt was nonverbal PTA but able to communicate.           OT Problem List: Decreased strength;Decreased range of motion;Decreased activity tolerance;Impaired balance (sitting and/or standing);Decreased coordination;Decreased cognition;Decreased safety awareness;Decreased knowledge of use of DME or AE;Decreased knowledge of precautions;Impaired sensation;Impaired tone;Obesity;Impaired  UE functional use   OT Treatment/Interventions: Self-care/ADL training;Therapeutic exercise;Neuromuscular education;Energy conservation;DME and/or AE instruction;Therapeutic activities;Cognitive remediation/compensation;Patient/family education;Balance training    OT Goals(Current goals can be found in the care plan section) Acute Rehab OT Goals Patient Stated Goal: none - pt non verbal, family would like for pt to return to SNF upon d/c OT Goal Formulation: Patient unable to participate in goal setting Time For Goal Achievement: 02/16/16 Potential to Achieve Goals: Fair ADL Goals Pt Will Perform Eating: with min assist;sitting Pt/caregiver will Perform Home Exercise Program: Left upper extremity;Increased ROM;With minimal assist Additional ADL Goal #1: Pt will sit EOB 5 minutes with min assist as precursor to ADL. Additional ADL Goal #2: Pt will follow 1 step command 50% of the time in a minimally distracting environment.  OT Frequency: Min 2X/week   Barriers to D/C:            Co-evaluation PT/OT/SLP Co-Evaluation/Treatment: Yes Reason for Co-Treatment: Complexity of the patient's impairments (multi-system involvement);For patient/therapist safety PT goals addressed during session: Mobility/safety with mobility;Balance;Strengthening/ROM OT goals addressed during session: ADL's and self-care      End of Session Equipment Utilized During Treatment: Gait belt;Oxygen  Activity Tolerance: Patient limited by lethargy Patient left: in  bed;with call bell/phone within reach;with bed alarm set;with family/visitor present   Time: [id number] OT Time Calculation (min): 25 min Charges:  OT General Charges $OT Visit: 1 Procedure OT Evaluation $OT Eval Moderate Complexity: 1 Procedure G-Codes:     Gaye AlkenBailey A Arlyne Brandes M.S., OTR/L Pager: [phone]  02/02/2016, 12:16 PM

## 2016-02-02 NOTE — Evaluation (Signed)
Physical Therapy Evaluation Patient Details Name: Mia SimmondsDorothy A Swaminathan MRN: [id number] DOB: 02/19/1941 Today's Date: 02/02/2016   History of Present Illness  75 y/o female who [facility] She began having left arm and leg twitching which lasted for approximately 3 minutes. During this time and immediately afterwards, she was completely unresponsive. En route to the hospital a code stroke was called. She had persistently deviated gaze to the right, but began using her left side purposefully. Initially with a gaze deviation,  There was concern for persistent  Seizure. CT showed subacute R MCA infarct.  Clinical Impression  Pt admitted with above diagnosis. Pt currently with functional limitations due to the deficits listed below (see PT Problem List). Currently requires +2 assist for bed mobility level and unable to perform active transfers. Pt lethargic during session and unable to follow one step commands at this time. Daughter present to provide [facility]. Pt will benefit from skilled PT to increase their independence and safety with mobility to allow discharge back to SNF.       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 Pt lethargic during eval requiring verbal and tactile stimulus to maintain alertness.  Transfers Overall transfer level: Needs assistance Equipment used: None Transfers: Sit to/from Stand Sit to Stand: +2 physical assistance;Total assist         General transfer  comment: Attempted to stand x 2 reps from EOB with total assist of 2 and no active motor initiation by patient  Ambulation/Gait             General Gait Details: non ambulatory PTA  Stairs            Wheelchair Mobility    Modified Rankin (Stroke Patients Only) Modified Rankin (Stroke Patients Only) Pre-Morbid Rankin Score: Moderately severe disability Modified Rankin: Severe disability     Balance Overall balance assessment: Needs assistance Sitting-balance support: Single extremity supported;No upper extremity supported Sitting balance-Leahy Scale: Zero Sitting balance - Comments: Pt requires total A to maintain sitting balance EOB x 5 min. Hand over hand assist to place L hand on rail to attempt to stabiilze Postural control: Posterior lean   Standing balance-Leahy Scale: Zero Standing balance comment: +2 to attempt to get into standing but unable to achieve full upright postiion                             Pertinent Vitals/Pain Pain Assessment:  (unable to rate. Did not appear to be in pain with movement)    Home Living Family/patient expects to be discharged to:: Skilled nursing facility                 Additional Comments: Has been in SNF since 297' per daughter report    Prior Function Level of Independence: Needs assistance   Gait / Transfers Assistance Needed: Daughter reports patient was able to get around in her w/c independently. She was nonambulatory but  could stand with assistance from staff and SNF.           Hand Dominance        Extremity/Trunk Assessment   Upper Extremity Assessment: Defer to OT evaluation           Lower Extremity Assessment: RLE deficits/detail;LLE deficits/detail RLE Deficits / Details: chronic R hemiparesis from prior CVA. No active movement noted during evaluation. Was able to weightbear PTA per daughter report LLE Deficits / Details: minimal movement noted with mobility; activated hip  adduction in supine  Cervical / Trunk Assessment: Kyphotic  Communication   Communication: Expressive difficulties;Receptive difficulties  Cognition Arousal/Alertness: Lethargic Behavior During Therapy: Flat affect Overall Cognitive Status: History of cognitive impairments - at baseline                      General Comments      Exercises     Assessment/Plan    PT Assessment Patient needs continued PT services  PT Problem List Decreased range of motion;Decreased strength;Decreased activity tolerance;Decreased balance;Decreased mobility;Decreased coordination;Decreased cognition;Decreased knowledge of use of DME;Decreased safety awareness;Cardiopulmonary status limiting activity;Impaired sensation;Impaired tone;Decreased skin integrity;Obesity;Pain          PT Treatment Interventions DME instruction;Functional mobility training;Therapeutic activities;Therapeutic exercise;Balance training;Neuromuscular re-education;Cognitive remediation;Patient/family education;Wheelchair mobility training;Manual techniques;Modalities    PT Goals (Current goals can be found in the Care Plan section)  Acute Rehab PT Goals Patient Stated Goal: none - pt non verbal PT Goal Formulation: With family (pt unable to participate) Time For Goal Achievement: 02/16/16 Potential to Achieve Goals: Fair    Frequency Min 2X/week   Barriers to discharge  (SNF) [facility]    Co-evaluation PT/OT/SLP Co-Evaluation/Treatment: Yes Reason for Co-Treatment: Complexity of the patient's impairments (multi-system involvement);For patient/therapist safety PT goals addressed during session: Mobility/safety with mobility;Balance;Strengthening/ROM         End of Session Equipment Utilized During Treatment: Gait belt;Oxygen Activity Tolerance: Patient limited by lethargy Patient left: in bed;with call bell/phone within reach;with bed alarm set;with family/visitor present Nurse Communication:  Mobility status;Need for lift equipment         Time: 1045-1103 PT Time Calculation (min) (ACUTE ONLY): 18 min   Charges:   PT Evaluation $PT Eval Moderate Complexity: 1 Procedure     PT G Codes:        Delorise RoyalsGray, Berkleigh Beckles Brescia  Angele Wiemann B. Thorvald Orsino, PT, DPT Pager #: [phone]  02/02/2016, 11:23 AM

## 2016-02-02 NOTE — Progress Notes (Signed)
Initial Nutrition Assessment  DOCUMENTATION CODES:   Obesity unspecified  INTERVENTION:   -RD will follow for diet advancement and supplement as appropriate -If goals of care are consistent with artificial nutrition and pt unable to safely take PO, recommend:  Initiate Jevity 1.2 @ 25 ml/hr and increase by 10 ml every 4 hours to goal rate of 55 ml/hr.   Tube feeding regimen provides 1584 kcal (100% of needs), 73 grams of protein, and 1065 ml of H2O.   NUTRITION DIAGNOSIS:   Inadequate oral intake related to inability to eat as evidenced by NPO status.  GOAL:   Patient will meet greater than or equal to 90% of their needs  MONITOR:   Diet advancement, Labs, Weight trends, Skin, I & O's  REASON FOR ASSESSMENT:   Low Braden    ASSESSMENT:   Mia Calhoun is a 75 y.o. female  who presents with CVA and new on set seizure.  Neuro following.   Pt admitted with CVA.   No nursing home records available for review. Pt was receiving nursing care at time of visit. Unable to perform nutrition-focused physical exam or obtain further nutrition hx.  Per wt records, wt has been stable over the past year.   Pt underwent BSE this AM, which recommended strict NPO. Palliative care has been consulted; family meeting scheduled for 02/03/16 AM to discuss goals of care.   Labs reviewed: CBGS: 93-204.   Diet Order:  Diet NPO time specified  Skin:  Reviewed, no issues  Last BM:  02/02/16  Height:   Ht Readings from Last 1 Encounters:  02/01/16 5\' 5"  (1.651 m)    Weight:   Wt Readings from Last 1 Encounters:  02/01/16 188 lb 4.4 oz (85.4 kg)    Ideal Body Weight:  56.8 kg  BMI:  Body mass index is 31.33 kg/m.  Estimated Nutritional Needs:   Kcal:  1500-1700  Protein:  70-85 grams  Fluid:  1.5-1.7 L  EDUCATION NEEDS:   No education needs identified at this time  Arwin Bisceglia A. Mayford KnifeWilliams, RD, LDN, CDE Pager: [phone] After hours Pager: [phone]

## 2016-02-02 NOTE — Care Management Note (Signed)
Case Management Note  Patient Details  Name: Mia Calhoun MRN: [id number] Date of Birth: 07/30/1940  Subjective/Objective:         Admitted with R MCA infarct and new onset  Seizure.  Hx of prior stroke with persistent right-sided weakness in 1996, chronic kidney disease stage III, chronic diastolic heart failure on diuretics, COPD, diabetes on insulin, iron deficiency anemia, hypertension, dyslipidemia and hypothyroidism. From [facility].  [name] (Daughter)     [phone]        Action/Plan:  Plan is to [facility] SNF[facility] when medically stables.... CSW managing disposition.  Expected Discharge Date:                  Expected Discharge Plan:  Skilled Nursing Facility [facility])  In-House Referral:  Clinical Social Work  Discharge planning Services  CM Consult  Post Acute Care Choice:    Choice offered to:     DME Arranged:    DME Agency:     HH Arranged:    HH Agency:     Status of Service:  Completed, signed off  If discussed at [employer] Meetings, dates discussed:    Additional Comments:  Epifanio LeschesCole, Saliou Barnier Hudson, RN 02/02/2016, 2:01 PM

## 2016-02-02 NOTE — Progress Notes (Signed)
STROKE TEAM PROGRESS NOTE   HISTORY OF PRESENT ILLNESS (per record) Mia SimmondsDorothy A Calhoun is a 75 y.o. female who presents with altered mental status. After discussing with the facility, she helps her self turn around 4 AM, but it is unclear to me if she was truly in her normal state at that time based on the history. Then around 5:30. She began having left arm and leg twitching which lasted for approximately 3 minutes. During this time and immediately afterwards, she was completely unresponsive. En route to the hospital a code stroke was called. She had persistently deviated gaze to the right, but began using her left side purposefully. Initially with a gaze deviation,  There was concern for persistent  Seizure and she was given Ativan 1mg   On arrival, head CT was obtained which shows a subacute right MCA territory infarct.  Her LKW is unclear. Patient was not administered IV t-PA secondary to unclear LKW. She was admitted for further evaluation and treatment.   SUBJECTIVE (INTERVAL HISTORY) Her daughters are at the bedside.  Pt had aneurysm s/p clip in 1979. Pt also had left large MCA infarct in 1996. She is currently living in NH with baseline wheelchair bound, not able to talk. Daughters stated that she can hold her self with assistance for transition from wheelchair to bed and able to understand commands. It seems that this time she developed left sided seizure activity with AMS. CT showed now right MCA small to moderate sized infarct.    OBJECTIVE Temp:  [98.2 F (36.8 C)-101.4 F (38.6 C)] 98.6 F (37 C) (12/06 1144) Pulse Rate:  [82-117] 95 (12/06 1144) Cardiac Rhythm: Normal sinus rhythm (12/06 0945) Resp:  [10-24] 15 (12/06 1144) BP: (112-159)/(46-95) 157/63 (12/06 1144) SpO2:  [95 %-100 %] 100 % (12/06 1144)  CBC:  Recent Labs Lab 02/01/16 0710 02/01/16 0719  WBC 15.0*  --   NEUTROABS 11.6*  --   HGB 12.5 14.3  HCT 38.6 42.0  MCV 72.4*  --   PLT 172  --     Basic  Metabolic Panel:  Recent Labs Lab 02/01/16 0710 02/01/16 0719  NA 136 137  K 5.1 5.1  CL 104 104  CO2 19*  --   GLUCOSE 185* 191*  BUN 42* 41*  CREATININE 1.65* 1.70*  CALCIUM 9.0  --     Lipid Panel:    Component Value Date/Time   CHOL 147 02/02/2016 0611   TRIG 95 02/02/2016 0611   HDL 30 (L) 02/02/2016 0611   CHOLHDL 4.9 02/02/2016 0611   VLDL 19 02/02/2016 0611   LDLCALC 98 02/02/2016 0611   HgbA1c:  Lab Results  Component Value Date   HGBA1C 14.5 10/06/2015   Urine Drug Screen: No results found for: LABOPIA, COCAINSCRNUR, LABBENZ, AMPHETMU, THCU, LABBARB    IMAGING I have personally reviewed the radiological images below and agree with the radiology interpretations.  Dg Chest Port 1 View 02/01/2016 Poor inspiration.  No active infiltrate or effusion.   Ct Head Code Stroke W/o Cm 02/01/2016  1. Acute/subacute nonhemorrhagic infarct involving the right frontal operculum and lentiform nucleus. 2. Large remote left MCA territory infarct. 3. Left sphenoid sinus opacification. 4. Polyp or mucous retention cyst in the left maxillary sinus 5. ASPECTS is 7/10   2D Echocardiogram  - Left ventricle: The cavity size was normal. Wall thickness was increased in a pattern of mild LVH. There was focal basal hypertrophy. Systolic function was normal. The estimated ejection fraction was in the  range of 55% to 60%. Wall motion was normal; there were no regional wall motion abnormalities. Doppler parameters are consistent with abnormal left ventricular relaxation (grade 1 diastolic dysfunction).  Carotid Doppler   pending  EEG This EEG demonstrated no focal, hemispheric, or lateralizing features.  There was no epileptiform activity recorded.  There was slowing of electrocerebral activity which can be seen in a wide variety of encephalopathic states including those of a toxic, metabolic, or degenerative nature.  The patient, however, was very drowsy during the recording and most of  the recording was spent in sleep.  Correlate clinically.   PHYSICAL EXAM  Temp:  [98.2 F (36.8 C)-101.4 F (38.6 C)] 98.6 F (37 C) (12/06 1144) Pulse Rate:  [82-117] 95 (12/06 1144) Resp:  [10-24] 15 (12/06 1144) BP: (112-159)/(46-95) 157/63 (12/06 1144) SpO2:  [95 %-100 %] 100 % (12/06 1144)  General - Well nourished, well developed, eyes open but not responding to commands.  Ophthalmologic - Fundi not visualized due to noncooperation.  Cardiovascular - Regular rate and rhythm.  Neuro - eyes open, but not responding to commands. Nonverbal, no seizure activity. Blinking to visual threat bilaterally, but inconsistent. Right facial droop, tongue in middle in mouth. RUE spastic plegia. RLE with pain mild withdraw at toes. LUE spontaneous movement, at least 3/5, LLE 2+/5 on pain stimulation. Bilateral babinski positive. DTR 3+ on the right. Sensation, coordination and gait not tested.    ASSESSMENT/PLAN Ms. Mia Calhoun is a 75 y.o. female with history of prior stroke with persistent right-sided weakness in 1996, chronic kidney disease stage III, chronic diastolic heart failure on diuretics, COPD, diabetes on insulin, iron deficiency anemia, hypertension, dyslipidemia and hypothyroidism presenting with R gaze preference. She did not receive IV t-PA due to unclear LKW.   Stroke:  R MCA infarct in setting of previous large L MCA infarct, new infarct felt to be embolic secondary to unknown source  CT small to moderate sized right MCA infarct, old left large MCA infarct  Carotid Doppler  pending   2D Echo  EF 55-60%. No source of embolus  EEG diffuse slowing and no seizure   LDL 98  HgbA1c pending  Lovenox 30 mg sq daily for VTE prophylaxis  Diet NPO time specified  No antithrombotic listed on MAR prior to admission, now on aspirin 300 mg suppository daily  Therapy recommendations:  Return to SNF  Palliative care meeting with family tomorrow. Family state she would not  want resuscitation in the event of cardiac or respiratory arrest.   Disposition:  pending  [facility] not able to talk, limited mobility)  Bilateral hemisphere involved with poor condition at baseline, her prognosis is poor, agree with palliative care consult.   Seizure  left sided seizure activity with AMS  Started on Vimpat 100mg  bid  EEG no seizure activity recorded  Dysphagia   Difficulty with secretion handling  High risk of aspiration pneumonia  NPO  Frequent suction  Likely need PEG depends on the disposition planning  Hypertension  Stable  Permissive hypertension (OK if < 220/120) but gradually normalize in 5-7 days  Long-term BP goal normotensive  Hyperlipidemia  Home meds:  zocor  LDL 98, goal < 70  Resume statin based on plan of care and passed swallow   Diabetes, type I  HgbA1c pending , goal < 7.0  On lantus  SSI  CBG monitoring  Other Stroke Risk Factors  Advanced age  Cigarette smoker  Obesity, Body mass index is  31.33 kg/m., recommend weight loss, diet and exercise as appropriate  Hx stroke/TIA  1979 - aneurysm s/p clip.   1996 - left large MCA infarct   Chronic diastolic CHF  Other Active Problems  COPD  PNA, on abx  AKI  baseline wheelchair bound, not able to talk.  Hospital day # 1  Marvel PlanJindong Abdelrahman Nair, MD PhD Stroke Neurology 02/02/2016 10:34 PM    To contact Stroke Continuity provider, please refer to [email or web address]. After hours, contact General Neurology

## 2016-02-02 NOTE — Progress Notes (Signed)
Palliative Care  Mrs. Mia Calhoun was off the floor when I arrived, and there was no family at the bedside. I called the patient's daughter--Babett Hine--and we planned for a family meeting with her, her sister, and her brother for tomorrow at 9am. I did provide her with my direct number in case she needs to reschedule this meeting.  Murrell ConverseSarah Nelsie Domino NP Palliative Care Team pager [phone]  No charge note

## 2016-02-02 NOTE — Evaluation (Signed)
Clinical/Bedside Swallow Evaluation Patient Details  Name: Raiford SimmondsDorothy A Doiron MRN: [id number] Date of Birth: 10/20/1940  Today's Date: 02/02/2016 Time: SLP Start Time (ACUTE ONLY): 1110 SLP Stop Time (ACUTE ONLY): 1124 SLP Time Calculation (min) (ACUTE ONLY): 14 min  Past Medical History:  Past Medical History:  Diagnosis Date  . Anemia, unspecified   . 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 CKD (chronic kidney disease) stage 3, GFR 30-59 ml/min   . Congestive heart failure, unspecified   . CVA (cerebral vascular accident) (HCC) 02/01/2016  . Depressive disorder, not elsewhere classified   . Edema   . Hemiplegia, unspecified, affecting unspecified side   . 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 Nontoxic multinodular goiter   . Osteoporosis, unspecified   . 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 Unspecified constipation   . Unspecified disorder of kidney and ureter   . Unspecified essential hypertension   . Unspecified hereditary and idiopathic peripheral neuropathy   . Unspecified hypothyroidism   . Unspecified late effects of cerebrovascular disease   . Unspecified vitamin D deficiency    Past Surgical History:  Past Surgical History:  Procedure Laterality Date  . CEREBRAL ANEURYSM REPAIR     per family  . EYE SURGERY Left 03/22/2009   h/o diabetic retinopathy   HPI:  Red ChristiansDorothy A Dorsettis a 75 y.o.femalewith medical history significant for prior stroke with persistent right-sided weakness in 1996, chronic kidney disease stage III, chronic diastolic heart failure on diuretics, COPD, diabetes on insulin, iron deficiency anemia, hypertension, dyslipidemia and hypothyroidism. Presents with new large right frontal lobe infarct involving the right frontal operculum in setting of large remote left MCA territory infarct [phone].Per MD note patient with chronic aphasia, nonverbal, but  no dysphagia, able to feed herself with her left arm.   Assessment / Plan / Recommendation Clinical Impression  Pt demonstrates cognitive deficits that are impacting ability to attempt PO. Pt is inattentive to environment, not following commands, no purposeful movement. When SLP provided max hand over hand assist for Po trials with tactile and verbal cues, pt is not appropriately responsive. She does not pucker lips to accept PO or manipulate the bolus. She does demonstrate intermittent reflexive swallows and also hard coughing indicating sensation of penetration of secretions. If arousal improves, pt could have potential ability to consume PO, though aspiration risk is difficult to determine at this time. Will f/u for further trials tomorrow with hope that pt will be more responsive with more time since her seizure and CVA. Family in room, aware of plan.     Aspiration Risk  Severe aspiration risk    Diet Recommendation NPO        Other  Recommendations     Follow up Recommendations Skilled Nursing facility      Frequency and Duration min 2x/week  2 weeks       Prognosis        Swallow Study   General HPI: Red ChristiansDorothy A Dorsettis a 75 y.o.femalewith medical history significant for prior stroke with persistent right-sided weakness in 1996, chronic kidney disease stage III, chronic diastolic heart failure on diuretics, COPD, diabetes on insulin, iron deficiency anemia, hypertension, dyslipidemia and hypothyroidism. Presents with new large right frontal lobe infarct involving the right frontal operculum in setting of large remote left MCA territory infarct [phone].Per MD note patient with chronic aphasia, nonverbal, but no dysphagia, able to feed herself with her left arm. Type of Study: Bedside  Swallow Evaluation Diet Prior to this Study: NPO Temperature Spikes Noted: No Respiratory Status: Room air History of Recent Intubation: No Behavior/Cognition: Lethargic/Drowsy;Doesn't follow  directions Oral Care Completed by SLP: No Oral Cavity - Dentition: Poor condition;Missing dentition Self-Feeding Abilities: Total assist Patient Positioning: Upright in bed Baseline Vocal Quality: Not observed Volitional Cough: Cognitively unable to elicit Volitional Swallow: Unable to elicit    Oral/Motor/Sensory Function     Ice Chips     Thin Liquid Thin Liquid: Impaired Presentation: Cup;Spoon Oral Phase Impairments: Poor awareness of bolus;Reduced lingual movement/coordination;Reduced labial seal Oral Phase Functional Implications: Prolonged oral transit;Oral holding;Left anterior spillage Pharyngeal  Phase Impairments: Suspected delayed Swallow    Nectar Thick Nectar Thick Liquid: Not tested   Honey Thick Honey Thick Liquid: Not tested   Puree Puree: Impaired Presentation: Spoon Oral Phase Impairments: Poor awareness of bolus   Solid   GO   Solid: Not tested       Harlon DittyBonnie Vanice Rappa, MA CCC-SLP [phone]  Dominion Kathan, Riley NearingBonnie Caroline 02/02/2016,11:32 AM

## 2016-02-02 NOTE — Progress Notes (Signed)
Echocardiogram 2D Echocardiogram has been performed.  Marisue Humblelexis N Essam Lowdermilk 02/02/2016, 2:28 PM

## 2016-02-02 NOTE — Progress Notes (Signed)
Pharmacy Antibiotic Note  Mia SimmondsDorothy A Calhoun is a 75 y.o. female admitted on 02/01/2016 with stroke.  Pharmacy has been consulted for Vancomycin dosing for pneumonia x 8 days. Also to begin Cefepime x 8 days.  Tmax 101.4, WBC 19.0 on 12/5, creatinine 1.70 on 12/5.  Plan:  Antibiotics to begin after blood cultures are drawn.  Vancomycin 1250 mg IV q 24hrs x 8 doses  Cefepime 1 gm IV q 24hrs x 8 doses (adjusted from q8hrs d/t renal function)  Will follow renal function, culture data, progress.  Vanc trough level at steady state.  Target vanc troughs 15-20 mcg/ml  Height: 5\' 5"  (165.1 cm) Weight: 188 lb 4.4 oz (85.4 kg) IBW/kg (Calculated) : 57  Temp (24hrs), Avg:99.6 F (37.6 C), Min:98.2 F (36.8 C), Max:101.4 F (38.6 C)   Recent Labs Lab 02/01/16 0710 02/01/16 0719  WBC 15.0*  --   CREATININE 1.65* 1.70*    Estimated Creatinine Clearance: 31.8 mL/min (by C-G formula based on SCr of 1.7 mg/dL (H)).    No Known Allergies  Antimicrobials this admission:  Vancomycin 12/6 >> (12/13)  Cefepime 12/6 >> (12/13)  Dose adjustments this admission:  n/a  Microbiology results:  12/5 MRSA PCR negative  12/5 urine -  12/6 blood x 2 -   12/6 sputum cx ordered -  Thank you for allowing pharmacy to be a part of this patient's care.  Dennie Fettersgan, Oswaldo Cueto Donovan, [place] Pager: [phone] 02/02/2016 11:06 AM

## 2016-02-03 ENCOUNTER — Inpatient Hospital Stay (HOSPITAL_COMMUNITY): Payer: Medicare Other

## 2016-02-03 DIAGNOSIS — I63511 Cerebral infarction due to unspecified occlusion or stenosis of right middle cerebral artery: Secondary | ICD-10-CM

## 2016-02-03 DIAGNOSIS — Z7189 Other specified counseling: Secondary | ICD-10-CM

## 2016-02-03 DIAGNOSIS — Z515 Encounter for palliative care: Secondary | ICD-10-CM

## 2016-02-03 DIAGNOSIS — J189 Pneumonia, unspecified organism: Secondary | ICD-10-CM

## 2016-02-03 DIAGNOSIS — R569 Unspecified convulsions: Secondary | ICD-10-CM

## 2016-02-03 LAB — GLUCOSE, CAPILLARY
GLUCOSE-CAPILLARY: 127 mg/dL — AB (ref 65–99)
GLUCOSE-CAPILLARY: 110 mg/dL — AB (ref 65–99)
GLUCOSE-CAPILLARY: 127 mg/dL — AB (ref 65–99)
Glucose-Capillary: 117 mg/dL — ABNORMAL HIGH (ref 65–99)
Glucose-Capillary: 111 mg/dL — ABNORMAL HIGH (ref 65–99)

## 2016-02-03 LAB — BASIC METABOLIC PANEL
ANION GAP: 9 (ref 5–15)
BUN: 27 mg/dL — ABNORMAL HIGH (ref 6–20)
CALCIUM: 8.5 mg/dL — AB (ref 8.9–10.3)
CO2: 21 mmol/L — ABNORMAL LOW (ref 22–32)
CREATININE: 1.34 mg/dL — AB (ref 0.44–1.00)
Chloride: 114 mmol/L — ABNORMAL HIGH (ref 101–111)
GFR, EST AFRICAN AMERICAN: 44 mL/min — AB (ref >60)
GFR, EST NON AFRICAN AMERICAN: 38 mL/min — AB (ref >60)
Glucose, Bld: 116 mg/dL — ABNORMAL HIGH (ref 65–99)
Potassium: 4.6 mmol/L (ref 3.5–5.1)
SODIUM: 144 mmol/L (ref 135–145)

## 2016-02-03 LAB — CBC
HEMATOCRIT: 36.7 % (ref 36.0–46.0)
Hemoglobin: 11.2 g/dL — ABNORMAL LOW (ref 12.0–15.0)
MCH: 23 pg — ABNORMAL LOW (ref 26.0–34.0)
MCHC: 30.5 g/dL (ref 30.0–36.0)
MCV: 75.4 fL — ABNORMAL LOW (ref 78.0–100.0)
PLATELETS: 190 10*3/uL (ref 150–400)
RBC: 4.87 MIL/uL (ref 3.87–5.11)
RDW: 15.6 % — AB (ref 11.5–15.5)
WBC: 12.6 10*3/uL — AB (ref 4.0–10.5)

## 2016-02-03 LAB — HEMOGLOBIN A1C
Hgb A1c MFr Bld: 8.6 % — ABNORMAL HIGH (ref 4.8–5.6)
Mean Plasma Glucose: 200 mg/dL

## 2016-02-03 LAB — STREP PNEUMONIAE URINARY ANTIGEN: STREP PNEUMO URINARY ANTIGEN: NEGATIVE

## 2016-02-03 MED ORDER — ENOXAPARIN SODIUM 40 MG/0.4ML ~~LOC~~ SOLN
40.0000 mg | SUBCUTANEOUS | Status: DC
  Administered 2016-02-03: 40 mg via SUBCUTANEOUS
  Filled 2016-02-03: qty 0.4

## 2016-02-03 NOTE — Plan of Care (Signed)
Problem: Education: Goal: Knowledge of disease or condition will improve Outcome: Progressing Daughter educated at bedside beginning of shift

## 2016-02-03 NOTE — Consult Note (Signed)
Consultation Note Date: 02/03/2016   Patient Name: Mia Calhoun  DOB: Jul 07, 1940  MRN: [id number]  Age / Sex: 75 y.o., female  PCP: Blanchie Serve, MD Referring Physician: Elmarie Shiley, MD  Reason for Consultation: Establishing goals of care and Psychosocial/spiritual support  HPI/Patient Profile: 75 y.o. 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 Work-up revealed a subacute R MCA small-moderate infarct (felt to be embolic), seizures, and PNA.   At baseline she was not on a dysphagia diet and was able to feed herself with her left arm, she was nonambulatory but was able to stand and pivot to get into the chair, she was also incontinent of bowel and bladder and wears diapers at the facility.  Clinical Assessment and Goals of Care: I met Mia Calhoun at her bedside, her three children were present. On my exam she was awake and would intermittently track me with her eyes. She could not follow commands, but did have spontaneous movement of her left side (non-purposeful). She appeared to be having issues managing her own secretions, with frequent coughing and drooling noted. No marked change since admission, per her family.  I then met with Mia Calhoun's three children outside of the room. We discussed her baseline functional status prior to admission, their understanding of her current health state, and options for future care trajectories. Prior to admission they related that Mia Calhoun found joy in going to the snack machine and smoking outside. She was not able to walk or talk, but did communicate her needs with gestures. Presently, they had a  good understanding that she had suffered another stroke (on the opposite side of the brain compared to the first one), and had experienced seizures. I also discussed her pneumonia--likely from aspiration--and her ongoing swallowing issues and high risk for further aspiration, even of her own secretions. We also discussed that her presentation now--non-interactive, unable to safely eat, non-purposeful movements--may not get better. Improvement, if any, could take as long as three months and she may never return to her baseline functional status, especially in light of her overall frailty and poor performance status prior to this hospitalization.   Finally, we discussed care trajectories. The possibility of a feeding tube had been brought up with them prior to our meeting, and they did not realize this was an optional procedure. They were clear on expressing that they did not want their mother "put on a machine," but were surprised to learn that a feeding tube providing artificial nutrition and hydration was a similar life prolonging measure, and a form of life support. I explained that the feeding tube would help keep her alive longer, but would not promote quality of life. We discussed the differences between life prolonging vs quality improving interventions, as well as the option to  do things to a person vs do things for a person.   Primary Decision Maker NEXT OF KIN; pt's three children cooperatively make decisions   SUMMARY OF RECOMMENDATIONS    Follow-up family meeting planned for 12/8 at 0900; will follow-up on their decision about the feeding tube, as well as care overall (I did bring up Hospice/comfort care as an option, which they want to think on further)  Continue full scope care at present  Code Status/Advance Care Planning:  DNR  Palliative Prophylaxis:   Bowel Regimen, Frequent Pain Assessment, Oral Care and Turn Reposition  Additional Recommendations (Limitations, Scope,  Preferences):  Full Scope Treatment  Psycho-social/Spiritual:   Desire for further Chaplaincy support:no  Additional Recommendations: TBD  Prognosis:   Unable to determine; prognosis will be strongly dependent on their decision about the feeding tube. If they decide to put it in, she will likely die secondary to an infection at an unknown future point (though I would expect in <8mobased on her frailty). If they decided to not put it in, she is not eating or drinking and I would expect death within 2 weeks. I discussed both of these trajectories with the family.   Discharge Planning: To Be Determined      Primary Diagnoses: Present on Admission: . Acute cerebrovascular accident (CVA) ([facility] . (Resolved) Anemia of chronic disease . Anemia, iron deficiency . Chronic diastolic congestive heart failure ([facility] . Chronic kidney disease, stage III (moderate) . Diabetes mellitus type 2 in obese ([facility] . Essential hypertension, benign . Hyperlipidemia LDL goal <100 . Emphysema of lung ([facility] . Hypothyroidism   I have reviewed the medical record, interviewed the patient and family, and examined the patient. The following aspects are pertinent.  Past Medical History:  Diagnosis Date  . Anemia, unspecified   . 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 CKD (chronic kidney disease) stage 3, GFR 30-59 ml/min   . Congestive heart failure, unspecified   . CVA (cerebral vascular accident) ([facility] 02/01/2016  . Depressive disorder, not elsewhere classified   . Edema   . Hemiplegia, unspecified, affecting unspecified side   . 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 Nontoxic multinodular goiter   . Osteoporosis, unspecified   . 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 Unspecified constipation   . Unspecified disorder of kidney and ureter   . Unspecified essential hypertension   . Unspecified  hereditary and idiopathic peripheral neuropathy   . Unspecified hypothyroidism   . Unspecified late effects of cerebrovascular disease   . Unspecified vitamin D deficiency    Social History   Social History  . Marital status: Single    Spouse name: N/A  . Number of children: N/A  . Years of education: N/A   Social History Main Topics  . Smoking status: Current Every Day Smoker  . Smokeless tobacco: Never Used  . Alcohol use No  . Drug use: No  . Sexual activity: Not Currently   Other Topics Concern  . None   Social History Narrative  . None   History reviewed. No pertinent family history. Scheduled Meds: . aspirin  300 mg Rectal Daily   Or  . aspirin  325 mg Oral Daily  . ceFEPime (MAXIPIME) IV  1 g Intravenous Q24H  . chlorhexidine  15 mL Mouth Rinse BID  . enoxaparin (LOVENOX) injection  30 mg Subcutaneous Q24H  . insulin aspart  0-9 Units Subcutaneous Q4H  . lacosamide (  VIMPAT) IV  100 mg Intravenous Q12H  . levothyroxine  25 mcg Intravenous QAC breakfast  . mouth rinse  15 mL Mouth Rinse q12n4p  . metronidazole  500 mg Intravenous Q8H  . vancomycin  1,250 mg Intravenous Q24H   Continuous Infusions: . sodium chloride 75 mL/hr at 02/03/16 0332   PRN Meds:.polyvinyl alcohol No Known Allergies   Review of Systems: Unable to evaluate, pt not interactive.  Physical Exam  Constitutional: Vital signs are normal. She appears lethargic. She has a sickly appearance.  Neck: Normal range of motion.  Pulmonary/Chest: Effort normal.  Frequent coughing, wet sounding vocalizations  Abdominal: Soft. Normal appearance.  Musculoskeletal:  Right sided weakness. Left side with spontaneous though non-purposeful movement against gravity.  Neurological: She appears lethargic.  Does not follow commands, no purposeful movements, occasionally will track me with her eyes-though not consistently. Slight right facial droop.  Skin: Skin is warm and dry.  Psychiatric:  Non-intearctive     Vital Signs: BP (!) 121/51 (BP Location: Right Arm)   Pulse 81   Temp 98.2 F (36.8 C) (Oral)   Resp 18   Ht _0  (1.651 m)   Wt 85.4 kg (188 lb 4.4 oz)   SpO2 100%   BMI 31.33 kg/m  Pain Assessment: Faces POSS *See Group Information*: S-Acceptable,Sleep, easy to arouse    SpO2: SpO2: 100 % O2 Device:SpO2: 100 % O2 Flow Rate: .O2 Flow Rate (L/min): 2 L/min  IO: Intake/output summary:  Intake/Output Summary (Last 24 hours) at 02/03/16 0843 Last data filed at 02/03/16 0701  Gross per 24 hour  Intake             2410 ml  Output                0 ml  Net             2410 ml    LBM: Last BM Date: 02/03/16 Baseline Weight: Weight: 85.4 kg (188 lb 4.4 oz) Most recent weight: Weight: 85.4 kg (188 lb 4.4 oz)     Palliative Assessment/Data:  PPS 10-20%   Flowsheet Rows   Flowsheet Row Most Recent Value  Intake Tab  Referral Department  Hospitalist  Unit at Time of Referral  Med/Surg Unit  Palliative Care Primary Diagnosis  Neurology  Date Notified  02/02/16  Palliative Care Type  New Palliative care  Reason for referral  Clarify Goals of Care  Date of Admission  02/01/16  Date first seen by Palliative Care  02/03/16  # of days Palliative referral response time  1 Day(s)  # of days IP prior to Palliative referral  1  Clinical Assessment  Psychosocial & Spiritual Assessment  Palliative Care Outcomes      Time In: 0850 Time Out: 1000 Time Total: 70 minutes Greater than 50%  of this time was spent counseling and coordinating care related to the above assessment and plan.  Signed by: Charlynn Court, NP Palliative Medicine Team Team Phone # [phone]

## 2016-02-03 NOTE — Progress Notes (Signed)
PROGRESS NOTE    AMARIONA RATHJE  ZOX:[id number] DOB: 09-13-1940 DOA: 02/01/2016 PCP: [name], MD    Brief Narrative: HPI: Mia Calhoun is a 75 y.o. female with medical history significant for prior stroke with persistent right-sided weakness in 1996, chronic kidney disease stage III, chronic diastolic heart failure on diuretics, COPD, diabetes on insulin, iron deficiency anemia, hypertension, dyslipidemia and hypothyroidism. Patient was last seen normal around 4 AM at nursing facility. Upon reevaluation by staff at 5:30 she was noticed to have left arm and leg twitching which lasted for 3 minutes with apparent postictal phase for several minutes. EMS was called and en route to hospital code stroke was initiated due to persistently deviated gaze on the right. While in route patient begin using her left side purposefully. Due to concerns for seizure activity she was given Ativan 1 mg. Upon arrival to ER noncontrast head CT was obtained which showed a subacute right MCA territory infarct large in size. In discussing with the patient's family at the bedside (patient with chronic aphasia and unable to participate in history) patient does not talk at baseline, she was not on a dysphagia diet and was able to feed herself with her left arm, she was nonambulatory but was able to stand and pivot to get into the chair, she is incontinent of bowel and bladder and wears diapers at the facility.   Assessment & Plan:   Principal Problem:   Acute cerebrovascular accident (CVA) (HCC) Active Problems:   Chronic kidney disease, stage III (moderate)   Diabetes mellitus type 2 in obese (HCC)   Hypothyroidism   Essential hypertension, benign   Hyperlipidemia LDL goal <100   Hemiplegia affecting dominant side, post-stroke (HCC)   Chronic diastolic congestive heart failure (HCC)   Anemia, iron deficiency   Emphysema of lung (HCC)   Aphasia as late effect of cerebrovascular accident (CVA)   New onset  seizure (HCC)   Pneumonia due to infectious organism   Goals of care, counseling/discussion   Palliative care encounter   1-Acute stroke, bilateral hemisphere;  Neurology consulted and following.  Patient didn't received TPA, unclear time last seen normal.  Patient at baseline was aphasic, right side hemiparesis, rigidity.  Continue with Vimpat to prevent  for seizure.  palliative care consulted for goals of care and for family support.  ECHO no source of embolism.   Swallow evaluation. Fail swallow today. Family will meet with palliative care 12-08 to discussed regarding tube feeding.  Aspirin per rectum.  Doppler: right 1-39 % left side not perform due to patient persistent coughing spells.  LDL 98.   2-PNA, Health care vs aspiration.  Concern for aspiration.  Started IV vancomycin and cefepime, flagyl to cover for oral flora. Day 2.  blood culture no growth to date.   3-HTN; permissive  HNT in setting of Acute stroke.  4-DM; hold long acting insulin due to NPO. SSI.  5-AKI; IV fluids, improving.    DVT prophylaxis: lovenox.  Code Status: DNR> discussed with family.  Family Communication: care discussed with daughters  who were at bedside.  Disposition Plan: to be determine   Consultants:   Neurology  Palliative care team for goals of care.      Procedures:    Antimicrobials:   Vancomycin, cefepime      Subjective: Patient aphasic, open eyes, was coughing less when I was in the room.    Objective: Vitals:   02/02/16 2030 02/03/16 0036 02/03/16 0412 02/03/16 1019  BP: (!) 152/62 (!) 150/69 (!) 121/51 (!) 139/44  Pulse: 97 97 81 83  Resp: 20 18 18 17   Temp: 98.7 F (37.1 C) 98.6 F (37 C) 98.2 F (36.8 C) 97.5 F (36.4 C)  TempSrc: Oral Oral Oral Oral  SpO2: 100% 100% 100% 100%  Weight:      Height:        Intake/Output Summary (Last 24 hours) at 02/03/16 1329 Last data filed at 02/03/16 1000  Gross per 24 hour  Intake             2410 ml    Output                0 ml  Net             2410 ml   Filed Weights   02/01/16 0700 02/01/16 0729  Weight: 85.4 kg (188 lb 4.4 oz) 85.4 kg (188 lb 4.4 oz)    Examination:  General exam: Appears calm and comfortable  Respiratory system: Clear to auscultation. Respiratory effort normal. Cardiovascular system: S1 & S2 heard, RRR. No JVD, murmurs, rubs, gallops or clicks. No pedal edema. Gastrointestinal system: Abdomen is nondistended, soft and nontender. No organomegaly or masses felt. Normal bowel sounds heard. Central nervous system: Alert , aphasic, coughing and difficulty managing oral secretions, right side with rigidity, left side with weakness.  Extremities: no edema.  Skin: No rashes, lesions or ulcers Psychiatry: unable to evaluate.     Data Reviewed: I have personally reviewed following labs and imaging studies  CBC:  Recent Labs Lab 02/01/16 0710 02/01/16 0719 02/03/16 0449  WBC 15.0*  --  12.6*  NEUTROABS 11.6*  --   --   HGB 12.5 14.3 11.2*  HCT 38.6 42.0 36.7  MCV 72.4*  --  75.4*  PLT 172  --  190   Basic Metabolic Panel:  Recent Labs Lab 02/01/16 0710 02/01/16 0719 02/03/16 0449  NA 136 137 144  K 5.1 5.1 4.6  CL 104 104 114*  CO2 19*  --  21*  GLUCOSE 185* 191* 116*  BUN 42* 41* 27*  CREATININE 1.65* 1.70* 1.34*  CALCIUM 9.0  --  8.5*   GFR: Estimated Creatinine Clearance: 40.4 mL/min (by C-G formula based on SCr of 1.34 mg/dL (H)). Liver Function Tests:  Recent Labs Lab 02/01/16 0710  AST 16  ALT 10*  ALKPHOS 77  BILITOT 0.5  PROT 7.3  ALBUMIN 3.4*   No results for input(s): LIPASE, AMYLASE in the last 168 hours. No results for input(s): AMMONIA in the last 168 hours. Coagulation Profile:  Recent Labs Lab 02/01/16 0710  INR 1.08   Cardiac Enzymes: No results for input(s): CKTOTAL, CKMB, CKMBINDEX, TROPONINI in the last 168 hours. BNP (last 3 results) No results for input(s): PROBNP in the last 8760  hours. HbA1C:  Recent Labs  02/02/16 0611  HGBA1C 8.6*   CBG:  Recent Labs Lab 02/02/16 1629 02/02/16 2357 02/03/16 0411 02/03/16 0746 02/03/16 1207  GLUCAP 138* 106* 117* 111* 127*   Lipid Profile:  Recent Labs  02/02/16 0611  CHOL 147  HDL 30*  LDLCALC 98  TRIG 95  CHOLHDL 4.9   Thyroid Function Tests:  Recent Labs  02/01/16 1642  TSH 0.644   Anemia Panel: No results for input(s): VITAMINB12, FOLATE, FERRITIN, TIBC, IRON, RETICCTPCT in the last 72 hours. Sepsis Labs: No results for input(s): PROCALCITON, LATICACIDVEN in the last 168 hours.  Recent Results (from the past 240  hour(s))  Urine culture     Status: None   Collection Time: 02/01/16  1:16 PM  Result Value Ref Range Status   Specimen Description URINE, CATHETERIZED  Final   Special Requests NONE  Final   Culture NO GROWTH  Final   Report Status 02/02/2016 FINAL  Final  MRSA PCR Screening     Status: None   Collection Time: 02/01/16  4:21 PM  Result Value Ref Range Status   MRSA by PCR NEGATIVE NEGATIVE Final    Comment:        The GeneXpert MRSA Assay (FDA approved for NASAL specimens only), is one component of a comprehensive MRSA colonization surveillance program. It is not intended to diagnose MRSA infection nor to guide or monitor treatment for MRSA infections.          Radiology Studies: No results found.      Scheduled Meds: . aspirin  300 mg Rectal Daily   Or  . aspirin  325 mg Oral Daily  . ceFEPime (MAXIPIME) IV  1 g Intravenous Q24H  . chlorhexidine  15 mL Mouth Rinse BID  . enoxaparin (LOVENOX) injection  30 mg Subcutaneous Q24H  . insulin aspart  0-9 Units Subcutaneous Q4H  . lacosamide (VIMPAT) IV  100 mg Intravenous Q12H  . levothyroxine  25 mcg Intravenous QAC breakfast  . mouth rinse  15 mL Mouth Rinse q12n4p  . metronidazole  500 mg Intravenous Q8H  . vancomycin  1,250 mg Intravenous Q24H   Continuous Infusions: . sodium chloride 75 mL/hr at  02/03/16 0332     LOS: 2 days    Time spent: 35 minutes.     Alba Coryegalado, Ladale Sherburn A, MD [facility] Pager [phone]  If 7PM-7AM, please contact night-coverage [email or web address] Password [email or web address] 02/03/2016, 1:29 PM

## 2016-02-03 NOTE — Progress Notes (Signed)
STROKE TEAM PROGRESS NOTE   SUBJECTIVE (INTERVAL HISTORY) No acute event overnight. Did not pass swallow and family is discussing about TF. Palliative care involved and working with family. Currently full code.    OBJECTIVE Temp:  [97.5 F (36.4 C)-98.6 F (37 C)] 98.4 F (36.9 C) (12/07 1358) Pulse Rate:  [80-97] 80 (12/07 1358) Cardiac Rhythm: Normal sinus rhythm (12/07 1951) Resp:  [17-18] 17 (12/07 1358) BP: (121-150)/(44-69) 141/49 (12/07 1358) SpO2:  [100 %] 100 % (12/07 1358)  CBC:   Recent Labs Lab 02/01/16 0710 02/01/16 0719 02/03/16 0449  WBC 15.0*  --  12.6*  NEUTROABS 11.6*  --   --   HGB 12.5 14.3 11.2*  HCT 38.6 42.0 36.7  MCV 72.4*  --  75.4*  PLT 172  --  190    Basic Metabolic Panel:   Recent Labs Lab 02/01/16 0710 02/01/16 0719 02/03/16 0449  NA 136 137 144  K 5.1 5.1 4.6  CL 104 104 114*  CO2 19*  --  21*  GLUCOSE 185* 191* 116*  BUN 42* 41* 27*  CREATININE 1.65* 1.70* 1.34*  CALCIUM 9.0  --  8.5*    Lipid Panel:     Component Value Date/Time   CHOL 147 02/02/2016 0611   TRIG 95 02/02/2016 0611   HDL 30 (L) 02/02/2016 0611   CHOLHDL 4.9 02/02/2016 0611   VLDL 19 02/02/2016 0611   LDLCALC 98 02/02/2016 0611   HgbA1c:  Lab Results  Component Value Date   HGBA1C 8.6 (H) 02/02/2016   Urine Drug Screen: No results found for: LABOPIA, COCAINSCRNUR, LABBENZ, AMPHETMU, THCU, LABBARB    IMAGING I have personally reviewed the radiological images below and agree with the radiology interpretations.  Dg Chest Port 1 View 02/01/2016 Poor inspiration.  No active infiltrate or effusion.   Ct Head Code Stroke W/o Cm 02/01/2016  1. Acute/subacute nonhemorrhagic infarct involving the right frontal operculum and lentiform nucleus. 2. Large remote left MCA territory infarct. 3. Left sphenoid sinus opacification. 4. Polyp or mucous retention cyst in the left maxillary sinus 5. ASPECTS is 7/10   2D Echocardiogram  - Left ventricle: The cavity  size was normal. Wall thickness was increased in a pattern of mild LVH. There was focal basal hypertrophy. Systolic function was normal. The estimated ejection fraction was in the range of 55% to 60%. Wall motion was normal; there were no regional wall motion abnormalities. Doppler parameters are consistent with abnormal left ventricular relaxation (grade 1 diastolic dysfunction).  Carotid Doppler   1-39% right ICA plaquing. Vertebral artery flow is antegrade.  Left carotid not imaged secondary to patient positioning to allow mucous to drain from mouth and constant coughing.   EEG This EEG demonstrated no focal, hemispheric, or lateralizing features.  There was no epileptiform activity recorded.  There was slowing of electrocerebral activity which can be seen in a wide variety of encephalopathic states including those of a toxic, metabolic, or degenerative nature.  The patient, however, was very drowsy during the recording and most of the recording was spent in sleep.  Correlate clinically.   PHYSICAL EXAM  Temp:  [97.5 F (36.4 C)-98.6 F (37 C)] 98.4 F (36.9 C) (12/07 1358) Pulse Rate:  [80-97] 80 (12/07 1358) Resp:  [17-18] 17 (12/07 1358) BP: (121-150)/(44-69) 141/49 (12/07 1358) SpO2:  [100 %] 100 % (12/07 1358)  General - Well nourished, well developed, eyes open but not responding to commands.  Ophthalmologic - Fundi not visualized due to noncooperation.  Cardiovascular - Regular rate and rhythm.  Neuro - eyes open, but not responding to commands. Nonverbal, no seizure activity. Blinking to visual threat bilaterally, but inconsistent. Right facial droop, tongue in middle in mouth. RUE spastic plegia. RLE with pain mild withdraw at toes. LUE spontaneous movement, at least 3/5, LLE 2+/5 on pain stimulation. Bilateral babinski positive. DTR 3+ on the right. Sensation, coordination and gait not tested.    ASSESSMENT/PLAN Ms. Mia Calhoun is a 75 y.o. female with history of  prior stroke with persistent right-sided weakness in 1996, chronic kidney disease stage III, chronic diastolic heart failure on diuretics, COPD, diabetes on insulin, iron deficiency anemia, hypertension, dyslipidemia and hypothyroidism presenting with R gaze preference. She did not receive IV t-PA due to unclear LKW.   Stroke:  R MCA infarct in setting of previous large L MCA infarct, new infarct felt to be embolic secondary to unknown source  CT small to moderate sized right MCA infarct, old left large MCA infarct  Carotid Doppler unremarkable right ICA, but left ICA not imaged due to noncooperation  2D Echo  EF 55-60%. No source of embolus  EEG diffuse slowing and no seizure   LDL 98  HgbA1c 8.6  Lovenox 30 mg sq daily for VTE prophylaxis Diet NPO time specified  No antithrombotic listed on MAR prior to admission, now on aspirin 300 mg suppository daily  Therapy recommendations:  Return to SNF  Disposition:  pending  Mia Calhoun(Mia Calhoun SN[facility] not able to talk, limited mobility)  Bilateral hemisphere involved with poor condition at baseline, her prognosis is poor, agree with palliative care consult.   Seizure  left sided seizure activity with AMS  Started on Vimpat 100mg  bid  EEG no seizure activity recorded  Continue vimpat  Dysphagia   Difficulty with secretion handling  High risk of aspiration pneumonia  NPO  Frequent suction  Likely need PEG depends on the disposition planning  Hypertension  Stable Permissive hypertension (OK if < 220/120) but gradually normalize in 5-7 days Long-term BP goal normotensive  Hyperlipidemia  Home meds:  zocor  LDL 98, goal < 70  Resume statin based on plan of care and passed swallow   Diabetes, type I  HgbA1c 8.6, goal < 7.0  uncontrolled  On lantus  SSI  CBG monitoring  Other Stroke Risk Factors  Advanced age  former smoker  Obesity, Body mass index is 31.33 kg/m., recommend weight loss, diet and exercise  as appropriate  Hx stroke/TIA  1979 - aneurysm s/p clip.   1996 - left large MCA infarct   Chronic diastolic CHF  Other Active Problems  COPD  PNA, on abx  AKI  baseline wheelchair bound, not able to talk.  Hospital day # 2  Mia PlanJindong Liston Thum, MD PhD Stroke Neurology 02/03/2016 10:11 PM    To contact Stroke Continuity provider, please refer to [email or web address]. After hours, contact General Neurology

## 2016-02-03 NOTE — [place] FL2 (Signed)
[place] MEDICAID FL2 LEVEL OF CARE SCREENING TOOL     IDENTIFICATION  Patient Name: Mia SimmondsDorothy A Calhoun Birthdate: 01/04/1941 Sex: female Admission Date (Current Location): 02/01/2016  [facility] [place]   Facility and Address:  [facility], [address], [place], [place] [address]      Provider Number: [id number]  Attending Physician Name and Address:  Alba CoryBelkys A Regalado, MD  Relative Name and Phone Number:       Current Level of Care: Hospital Recommended Level of Care: Skilled Nursing Facility Prior Approval Number:    Date Approved/Denied:   PASRR Number:    Discharge Plan: SNF    Current Diagnoses: Patient Active Problem List   Diagnosis Date Noted  . Pneumonia due to infectious organism   . Goals of care, counseling/discussion   . Palliative care encounter   . Acute cerebrovascular accident (CVA) (HCC) 02/01/2016  . New onset seizure (HCC) 02/01/2016  . Acute ischemic right MCA stroke (HCC)   . Diabetes mellitus with complication (HCC)   . Acute encephalopathy   . History of CVA with residual deficit 12/14/2015  . Aphasia as late effect of cerebrovascular accident (CVA) 12/14/2015  . Chronic constipation 08/12/2015  . Secondary hyperaldosteronism (HCC) 01/06/2015  . Emphysema of lung (HCC) 10/20/2014  . 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 Obstructive chronic bronchitis without exacerbation (HCC) 05/24/2014  . Chronic diastolic congestive heart failure (HCC) 03/26/2014  . Anemia, iron deficiency 03/26/2014  . Cataract 02/16/2014  . Insomnia 02/16/2014  . Gastroesophageal reflux disease without esophagitis 02/16/2014  . Hemiplegia affecting dominant side, post-stroke (HCC) 10/23/2013  . Nontoxic multinodular goiter 05/30/2013  . Severe nonproliferative diabetic retinopathy(362.06) 04/30/2013  . Chronic airway obstruction, not elsewhere classified 04/30/2013  . Peripheral angiopathy in diseases  classified elsewhere (HCC) 04/30/2013  . Hypothyroidism 03/28/2013  . Essential hypertension, benign 03/28/2013  . GERD (gastroesophageal reflux disease) 03/28/2013  . Hyperlipidemia LDL goal <100 03/28/2013  . Other iron deficiency anemias 10/04/2012  . Senile osteoporosis 10/04/2012  . Benign hypertensive heart and kidney disease with heart failure and with chronic kidney disease stage I through stage IV, or unspecified(404.11) 10/04/2012  . Chronic kidney disease, stage III (moderate) 10/04/2012  . Diabetes mellitus type 2 in obese (HCC) 10/04/2012    Orientation RESPIRATION BLADDER Height & Weight     Self, Place  O2 (3L) Incontinent Weight: 188 lb 4.4 oz (85.4 kg) Height:  5\' 5"  (165.1 cm)  BEHAVIORAL SYMPTOMS/MOOD NEUROLOGICAL BOWEL NUTRITION STATUS      Incontinent Diet (Currently NPO, please see DC summary for recommendations)  AMBULATORY STATUS COMMUNICATION OF NEEDS Skin   Extensive Assist Verbally Normal                       Personal Care Assistance Level of Assistance  Bathing, Feeding, Dressing Bathing Assistance: Limited assistance Feeding assistance: Limited assistance Dressing Assistance: Limited assistance     Functional Limitations Info  Hearing, Speech, Sight Sight Info: Adequate Hearing Info: Adequate Speech Info: Impaired    SPECIAL CARE FACTORS FREQUENCY  PT (By licensed PT), OT (By licensed OT), Speech therapy, Restorative feeding program             Speech Therapy Frequency: 2x      Contractures Contractures Info: Not present    Additional Factors Info  Code Status, Allergies Code Status Info: DNR Allergies Info: NKA  Current Medications (02/03/2016):  This is the current hospital active medication list Current Facility-Administered Medications  Medication Dose Route Frequency Provider Last Rate Last Dose  . 0.9 %  sodium chloride infusion   Intravenous Continuous Russella DarAllison L Ellis, NP 75 mL/hr at 02/03/16 0332    .  aspirin suppository 300 mg  300 mg Rectal Daily Russella DarAllison L Ellis, NP   300 mg at 02/03/16 1021   Or  . aspirin tablet 325 mg  325 mg Oral Daily Russella DarAllison L Ellis, NP      . ceFEPIme (MAXIPIME) 1 g in dextrose 5 % 50 mL IVPB  1 g Intravenous Q24H Belkys A Regalado, MD   1 g at 02/03/16 1224  . chlorhexidine (PERIDEX) 0.12 % solution 15 mL  15 mL Mouth Rinse BID Ozella Rocksavid J Merrell, MD   15 mL at 02/03/16 1013  . enoxaparin (LOVENOX) injection 40 mg  40 mg Subcutaneous Q24H Belkys A Regalado, MD      . insulin aspart (novoLOG) injection 0-9 Units  0-9 Units Subcutaneous Q4H Russella DarAllison L Ellis, NP   1 Units at 02/03/16 1232  . lacosamide (VIMPAT) 100 mg in sodium chloride 0.9 % 25 mL IVPB  100 mg Intravenous Q12H Rejeana BrockMcNeill P Kirkpatrick, MD   100 mg at 02/03/16 1013  . levothyroxine (SYNTHROID, LEVOTHROID) injection 25 mcg  25 mcg Intravenous QAC breakfast Russella DarAllison L Ellis, NP   25 mcg at 02/03/16 0735  . MEDLINE mouth rinse  15 mL Mouth Rinse q12n4p Ozella Rocksavid J Merrell, MD   15 mL at 02/03/16 1409  . metroNIDAZOLE (FLAGYL) IVPB 500 mg  500 mg Intravenous Q8H Belkys A Regalado, MD   500 mg at 02/03/16 1225  . polyvinyl alcohol (LIQUIFILM TEARS) 1.4 % ophthalmic solution 1 drop  1 drop Both Eyes QID PRN Russella DarAllison L Ellis, NP      . vancomycin (VANCOCIN) 1,250 mg in sodium chloride 0.9 % 250 mL IVPB  1,250 mg Intravenous Q24H Scarlett Prestoheresa D Egan, RPH   1,250 mg at 02/03/16 1409     Discharge Medications: Please see discharge summary for a list of discharge medications.  Relevant Imaging Results:  Relevant Lab Results:   Additional Information SSN: [id number]  Raye SorrowCoble, Aurther Harlin N, [place]

## 2016-02-03 NOTE — Progress Notes (Signed)
Speech Language Pathology Treatment: Dysphagia  Patient Details Name: Mia SimmondsDorothy A Calhoun MRN: [id number] DOB: 01/31/1941 Today's Date: 02/03/2016 Time: 0905-0920 SLP Time Calculation (min) (ACUTE ONLY): 15 min  Assessment / Plan / Recommendation Clinical Impression  Attempted trials of PO with pt who was more alert today, visually tracking SLP, reaching out for cup in visual field. However, pt continues to exhibit hard coughing and choking with every attempt to swallow her secretions, water or puree. Total assist is needed to open pts mouth for bolus, though she does transit boluses with pumping mechanism, though again this is followed by obvious aspiration events. Pt is incapable of consuming PO at this time, safely or even for comfort due to obvious suffering from choking. She will be at high risk of aspriation of her secretions regardless of plan for PO. Will continue to follow.    HPI HPI: Mia ChristiansDorothy A Dorsettis a 75 y.o.femalewith medical history significant for prior stroke with persistent right-sided weakness in 1996, chronic kidney disease stage III, chronic diastolic heart failure on diuretics, COPD, diabetes on insulin, iron deficiency anemia, hypertension, dyslipidemia and hypothyroidism. Presents with new large right frontal lobe infarct involving the right frontal operculum in setting of large remote left MCA territory infarct [phone].Per MD note patient with chronic aphasia, nonverbal, but no dysphagia, able to feed herself with her left arm.      SLP Plan  Continue with current plan of care     Recommendations  Diet recommendations: NPO                Plan: Continue with current plan of care       GO                Mia Calhoun, Mia NearingBonnie Calhoun 02/03/2016, 9:45 AM

## 2016-02-03 NOTE — Progress Notes (Signed)
VASCULAR LAB PRELIMINARY  PRELIMINARY  PRELIMINARY  PRELIMINARY  Right carotid duplex completed.    Preliminary report:  1-39% right ICA plaquing. Vertebral artery flow is antegrade.  Left carotid not imaged secondary to patient positioning to allow mucous to drain from mouth and constant coughing.   Chanika Byland, RVT 02/03/2016, 1:03 PM

## 2016-02-04 LAB — VAS US CAROTID
RCCAPSYS: -50 cm/s
RIGHT ECA DIAS: -4 cm/s
RIGHT VERTEBRAL DIAS: -9 cm/s
Right CCA prox dias: -2 cm/s
Right cca dist sys: -57 cm/s

## 2016-02-04 LAB — BASIC METABOLIC PANEL
ANION GAP: 8 (ref 5–15)
BUN: 22 mg/dL — AB (ref 6–20)
CHLORIDE: 116 mmol/L — AB (ref 101–111)
CO2: 22 mmol/L (ref 22–32)
Calcium: 8.4 mg/dL — ABNORMAL LOW (ref 8.9–10.3)
Creatinine, Ser: 1.3 mg/dL — ABNORMAL HIGH (ref 0.44–1.00)
GFR calc Af Amer: 46 mL/min — ABNORMAL LOW (ref >60)
GFR calc non Af Amer: 40 mL/min — ABNORMAL LOW (ref >60)
Glucose, Bld: 128 mg/dL — ABNORMAL HIGH (ref 65–99)
POTASSIUM: 4.3 mmol/L (ref 3.5–5.1)
SODIUM: 146 mmol/L — AB (ref 135–145)

## 2016-02-04 LAB — CBC
HCT: 31.3 % — ABNORMAL LOW (ref 36.0–46.0)
HEMOGLOBIN: 9.7 g/dL — AB (ref 12.0–15.0)
MCH: 23.3 pg — AB (ref 26.0–34.0)
MCHC: 31 g/dL (ref 30.0–36.0)
MCV: 75.1 fL — ABNORMAL LOW (ref 78.0–100.0)
Platelets: 205 10*3/uL (ref 150–400)
RBC: 4.17 MIL/uL (ref 3.87–5.11)
RDW: 15.8 % — ABNORMAL HIGH (ref 11.5–15.5)
WBC: 10.3 10*3/uL (ref 4.0–10.5)

## 2016-02-04 LAB — GLUCOSE, CAPILLARY
GLUCOSE-CAPILLARY: 105 mg/dL — AB (ref 65–99)
GLUCOSE-CAPILLARY: 142 mg/dL — AB (ref 65–99)
Glucose-Capillary: 120 mg/dL — ABNORMAL HIGH (ref 65–99)

## 2016-02-04 MED ORDER — BIOTENE DRY MOUTH MT LIQD: 15.0000 mL | OROMUCOSAL | Status: DC | PRN

## 2016-02-04 MED ORDER — SODIUM CHLORIDE 0.45 % IV SOLN
INTRAVENOUS | Status: DC
  Administered 2016-02-04: 09:00:00 via INTRAVENOUS

## 2016-02-04 MED ORDER — LACOSAMIDE 10 MG/ML PO SOLN: 100.0000 mg | Freq: Two times a day (BID) | ORAL | 0 refills | Status: AC

## 2016-02-04 MED ORDER — ACETAMINOPHEN 650 MG RE SUPP: 650.0000 mg | Freq: Four times a day (QID) | RECTAL | Status: DC | PRN

## 2016-02-04 MED ORDER — GLYCOPYRROLATE 0.2 MG/ML IJ SOLN: 0.2000 mg | INTRAMUSCULAR | Status: DC | PRN

## 2016-02-04 MED ORDER — ACETAMINOPHEN 650 MG RE SUPP: 650.0000 mg | Freq: Four times a day (QID) | RECTAL | 0 refills | Status: AC | PRN

## 2016-02-04 MED ORDER — GLYCOPYRROLATE 0.2 MG/ML IJ SOLN: 0.2000 mg | INTRAMUSCULAR | 0 refills | Status: AC | PRN

## 2016-02-04 MED ORDER — ONDANSETRON HCL 4 MG/2ML IJ SOLN: 4.0000 mg | Freq: Four times a day (QID) | INTRAMUSCULAR | Status: DC | PRN

## 2016-02-04 NOTE — Progress Notes (Signed)
Nutrition Brief Note  Chart reviewed. Pt now transitioning to comfort care.  No further nutrition interventions warranted at this time.  Please re-consult as needed.   Nakai Pollio A. Donyell Ding, RD, LDN, CDE Pager: [phone] After hours Pager: [phone]  

## 2016-02-04 NOTE — Care Management Important Message (Signed)
Important Message  Patient Details  Name: Mia SimmondsDorothy A Wempe MRN: [id number] Date of Birth: 03/18/1940   Medicare Important Message Given:  Yes    [name], Shaylon Aden Abena 02/04/2016, 12:26 PM

## 2016-02-04 NOTE — Progress Notes (Addendum)
12:30pm [facility] will not have beds today and likely will not have availability tomorrow either- CSW informed pt family at bedside.  Family now is interested in [facility] as alternate option.  CSW called [facility]- they have beds available- CSW sent referral for review.  10:30am CSW informed of family preference for residential hospice placement- CSW met with pt dtr at bedside to confirm- Dtrs first choice is [facility]- referral made to [facility]  CSW will continue to follow  Jorge Ny, [place] Social Worker [phone]

## 2016-02-04 NOTE — Progress Notes (Signed)
[facility] Anticipated discharge date: 12/8 Family notified: at bedside Transportation by [facility]- called at 2:20pm  CSW signing off.  Burna SisJenna H. Sandie Swayze, LCSW Clinical Social Worker [phone]

## 2016-02-04 NOTE — Discharge Summary (Signed)
Physician Discharge Summary  Mia Calhoun WJX:[id number] DOB: [birth year] DOA: 02/01/2016  PCP: [name], MD  Admit date: 02/01/2016 Discharge date: 02/04/2016  Admitted From: SNF Disposition: Residential hospice.   Recommendations for Outpatient Follow-up:  1. Follow up with PCP in 1-2 weeks 2. Please obtain BMP/CBC in one week 3.    Discharge Condition: Guarded.  CODE STATUS: DNR Diet recommendation: npo  Brief/Interim Summary: HPI: Mia Hasler Dorsettis a 75 y.o.femalewith medical history significant for prior stroke with persistent right-sided weakness in 1996, chronic kidney disease stage III, chronic diastolic heart failure on diuretics, COPD, diabetes on insulin, iron deficiency anemia, hypertension, dyslipidemia and hypothyroidism. Patient was last seen normal around 4 AM at nursing facility. Upon reevaluation by staff at 5:30 she was noticed to have left arm and leg twitching which lasted for 3 minutes with apparent postictal phase for several minutes. EMS was called and en route to hospital code stroke was initiated due to persistently deviated gaze on the right. While in route patient begin using her left side purposefully. Due to concerns for seizure activity she was given Ativan 1 mg. Upon arrival to ER noncontrast head CT was obtained which showed a subacute right MCA territory infarct large in size. In discussing with the patient's family at the bedside (patient with chronic aphasia and unable to participate in history)patient does not talk at baseline, she was not on a dysphagia diet and was able to feed herself with her left arm, she was nonambulatory but was able to stand and pivot to get into the chair, she is incontinent of bowel and bladder and wears diapers at the facility.   Assessment & Plan: 1-Acute stroke, bilateral hemisphere;  Neurology consulted and following.  Patient didn't received TPA, unclear time last seen normal.  Patient at baseline was  aphasic, right side hemiparesis, rigidity.  Continue with Vimpat to prevent  for seizure. Change to liquid form at discharge palliative care consulted for goals of care and for family support. Family has decide for full comfort care. No tube feeding of [eg tube.  ECHO no source of embolism.    Aspirin discontinue at discharge, patient is full comfort care.  Doppler: right 1-39 % left side not perform due to patient persistent coughing spells.  LDL 98. No statins at discharge, patient is comfort care   2-PNA, Health care vs aspiration.  Concern for aspiration.  Started IV vancomycin and cefepime, flagyl to cover for oral flora. Day 3.  blood culture no growth to date.  suspect aspiration PNA. Now comfort care.   3-HTN; permissive  HNT in setting of Acute stroke.  4-DM; hold long acting insulin due to NPO. SSI.  5-AKI; IV fluids,improved. Stop IV fluids at discharge    Discharge Diagnoses:  Principal Problem:   Acute cerebrovascular accident (CVA) (HCC) Active Problems:   Chronic kidney disease, stage III (moderate)   Diabetes mellitus type 2 in obese (HCC)   Hypothyroidism   Essential hypertension, benign   Hyperlipidemia LDL goal <100   Hemiplegia affecting dominant side, post-stroke (HCC)   Chronic diastolic congestive heart failure (HCC)   Anemia, iron deficiency   Emphysema of lung (HCC)   Aphasia as late effect of cerebrovascular accident (CVA)   New onset seizure (HCC)   Pneumonia due to infectious organism   Goals of care, counseling/discussion   Palliative care encounter    Discharge Instructions  Discharge Instructions    Diet - low sodium heart healthy    Complete by:  As directed    Increase activity slowly    Complete by:  As directed        Medication List    STOP taking these medications   acetaminophen 650 MG CR tablet Commonly known as:  TYLENOL Replaced by:  acetaminophen 650 MG suppository   CERTAVITE/ANTIOXIDANTS Tabs   cholecalciferol  1000 units tablet Commonly known as:  VITAMIN D   fluticasone 50 MCG/ACT nasal spray Commonly known as:  FLONASE   furosemide 20 MG tablet Commonly known as:  LASIX   insulin glargine 100 UNIT/ML injection Commonly known as:  LANTUS   insulin lispro 100 UNIT/ML injection Commonly known as:  HUMALOG   iron polysaccharides 150 MG capsule Commonly known as:  NIFEREX   lisinopril 2.5 MG tablet Commonly known as:  PRINIVIL,ZESTRIL   metoprolol succinate 50 MG 24 hr tablet Commonly known as:  TOPROL-XL   sennosides-docusate sodium 8.6-50 MG tablet Commonly known as:  SENOKOT-S   simvastatin 10 MG tablet Commonly known as:  ZOCOR   spironolactone 25 MG tablet Commonly known as:  ALDACTONE     TAKE these medications   acetaminophen 650 MG suppository Commonly known as:  TYLENOL Place 1 suppository (650 mg total) rectally every 6 (six) hours as needed for fever. Replaces:  acetaminophen 650 MG CR tablet   glycopyrrolate 0.2 MG/ML injection Commonly known as:  ROBINUL Inject 1 mL (0.2 mg total) into the vein every 4 (four) hours as needed (excessive secretions).   guaifenesin 100 MG/5ML syrup Commonly known as:  ROBITUSSIN Take 10 mL by mouth every 8 hours as needed for cough   lacosamide 10 MG/ML oral solution Commonly known as:  VIMPAT Take 10 mLs (100 mg total) by mouth 2 (two) times daily.   levothyroxine 50 MCG tablet Commonly known as:  SYNTHROID, LEVOTHROID Take 50 mcg by mouth daily before breakfast. Take 1 tablet daily for thyroid therapy.   polyvinyl alcohol 1.4 % ophthalmic solution Commonly known as:  LIQUIFILM TEARS 1 drop 4 (four) times daily as needed for dry eyes. Wait 3-5 minutes between 2 eye meds       No Known Allergies  Consultations:  Neurology  Palliative   Procedures/Studies: Dg Chest Port 1 View  Result Date: 02/01/2016 CLINICAL DATA:  Altered mental status EXAM: PORTABLE CHEST 1 VIEW COMPARISON:  CT chest screening scan of  04/08/2015 and chest x-ray of 05/20/1998 and FINDINGS: The lungs are poorly aerated with volume loss. No definite pneumonia or effusion is seen. Heart size is within normal limits. No acute bony abnormality is seen. There is degenerative change noted in the shoulders. A tubing overlies the right chest. IMPRESSION: Poor inspiration.  No active infiltrate or effusion. Electronically Signed   By: Dwyane DeePaul  Barry M.D.   On: 02/01/2016 09:07   Ct Head Code Stroke W/o Cm  Result Date: 02/01/2016 CLINICAL DATA:  Code stroke. New onset a aphasia with fixed gaze to the right. Remote infarct with right-sided weakness. EXAM: CT HEAD WITHOUT CONTRAST TECHNIQUE: Contiguous axial images were obtained from the base of the skull through the vertex without intravenous contrast. COMPARISON:  CT head without contrast 05/19/2008. FINDINGS: Brain: The a remote large left MCA territory infarct is again noted. A new large right frontal lobe infarct involves the right frontal operculum. There is some involvement of the lentiform nucleus. No acute hemorrhage is present. There is some effacement of the sulci within the infarct territory on the right. Will layering degeneration is present along the left  side the brainstem. The cerebellum is intact. Vascular: Atherosclerotic calcifications are present within the cavernous internal carotid arteries bilaterally without a significant hyperdense vessel. Skull: Occipital craniotomy is again noted. The calvarium is otherwise intact. No focal lytic or blastic lesions are present. Sinuses/Orbits: The left sphenoid sinus is opacified. A polyp or mucous retention cyst is present posteriorly within the left maxillary sinus. Torus palatini are noted. The incisive foramen of the maxilla is expanded. ASPECTS [facility]([place] Stroke Program Early CT Score) - Ganglionic level infarction (caudate, lentiform nuclei, internal capsule, insula, M1-M3 cortex): 5/7 - Supraganglionic infarction (M4-M6 cortex): 2/3 Total  score (0-10 with 10 being normal): 7/10 IMPRESSION: 1. Acute/subacute nonhemorrhagic infarct involving the right frontal operculum and lentiform nucleus. 2. Large remote left MCA territory infarct. 3. Left sphenoid sinus opacification. 4. Polyp or mucous retention cyst in the left maxillary sinus 5. ASPECTS is 7/10 These results were called by telephone at the time of interpretation on 02/01/2016 at 7:33 am to Dr. Amada JupiterKIRKPATRICK, who verbally acknowledged these results. Electronically Signed   By: Marin Robertshristopher  Mattern M.D.   On: 02/01/2016 07:39       Subjective: Non verbal, still with cough and difficulty managing secretions.   Discharge Exam: Vitals:   02/03/16 2225 02/04/16 0809  BP: (!) 141/51 (!) 162/53  Pulse: 86 89  Resp: 18 16  Temp: 97.6 F (36.4 C) 99.7 F (37.6 C)   Vitals:   02/03/16 1019 02/03/16 1358 02/03/16 2225 02/04/16 0809  BP: (!) 139/44 (!) 141/49 (!) 141/51 (!) 162/53  Pulse: 83 80 86 89  Resp: 17 17 18 16   Temp: 97.5 F (36.4 C) 98.4 F (36.9 C) 97.6 F (36.4 C) 99.7 F (37.6 C)  TempSrc: Oral Oral Oral Oral  SpO2: 100% 100% 100% 97%  Weight:      Height:        General: Pt is alert, awake, not in acute distress, non verbal.  Cardiovascular: RRR, S1/S2 +, no rubs, no gallops Respiratory: CTA bilaterally, no wheezing, no rhonchi Abdominal: Soft, NT, ND, bowel sounds + Extremities: no edema, no cyanosis    The results of significant diagnostics from this hospitalization (including imaging, microbiology, ancillary and laboratory) are listed below for reference.     Microbiology: Recent Results (from the past 240 hour(s))  Urine culture     Status: None   Collection Time: 02/01/16  1:16 PM  Result Value Ref Range Status   Specimen Description URINE, CATHETERIZED  Final   Special Requests NONE  Final   Culture NO GROWTH  Final   Report Status 02/02/2016 FINAL  Final  MRSA PCR Screening     Status: None   Collection Time: 02/01/16  4:21 PM  Result  Value Ref Range Status   MRSA by PCR NEGATIVE NEGATIVE Final    Comment:        The GeneXpert MRSA Assay (FDA approved for NASAL specimens only), is one component of a comprehensive MRSA colonization surveillance program. It is not intended to diagnose MRSA infection nor to guide or monitor treatment for MRSA infections.   Culture, blood (routine x 2) Call MD if unable to obtain prior to antibiotics being given     Status: None (Preliminary result)   Collection Time: 02/02/16  2:49 PM  Result Value Ref Range Status   Specimen Description BLOOD BLOOD RIGHT HAND  Final   Special Requests IN PEDIATRIC BOTTLE 1CC  Final   Culture NO GROWTH 2 DAYS  Final   Report Status  PENDING  Incomplete  Culture, blood (routine x 2) Call MD if unable to obtain prior to antibiotics being given     Status: None (Preliminary result)   Collection Time: 02/02/16  2:50 PM  Result Value Ref Range Status   Specimen Description BLOOD BLOOD RIGHT HAND  Final   Special Requests IN PEDIATRIC BOTTLE 1CC  Final   Culture NO GROWTH 2 DAYS  Final   Report Status PENDING  Incomplete     Labs: BNP (last 3 results) No results for input(s): BNP in the last 8760 hours. Basic Metabolic Panel:  Recent Labs Lab 02/01/16 0710 02/01/16 0719 02/03/16 0449 02/04/16 0733  NA 136 137 144 146*  K 5.1 5.1 4.6 4.3  CL 104 104 114* 116*  CO2 19*  --  21* 22  GLUCOSE 185* 191* 116* 128*  BUN 42* 41* 27* 22*  CREATININE 1.65* 1.70* 1.34* 1.30*  CALCIUM 9.0  --  8.5* 8.4*   Liver Function Tests:  Recent Labs Lab 02/01/16 0710  AST 16  ALT 10*  ALKPHOS 77  BILITOT 0.5  PROT 7.3  ALBUMIN 3.4*   No results for input(s): LIPASE, AMYLASE in the last 168 hours. No results for input(s): AMMONIA in the last 168 hours. CBC:  Recent Labs Lab 02/01/16 0710 02/01/16 0719 02/03/16 0449 02/04/16 0733  WBC 15.0*  --  12.6* 10.3  NEUTROABS 11.6*  --   --   --   HGB 12.5 14.3 11.2* 9.7*  HCT 38.6 42.0 36.7 31.3*   MCV 72.4*  --  75.4* 75.1*  PLT 172  --  190 205   Cardiac Enzymes: No results for input(s): CKTOTAL, CKMB, CKMBINDEX, TROPONINI in the last 168 hours. BNP: Invalid input(s): POCBNP CBG:  Recent Labs Lab 02/03/16 1634 02/03/16 1946 02/04/16 0001 02/04/16 0502 02/04/16 0805  GLUCAP 110* 127* 105* 120* 142*   D-Dimer No results for input(s): DDIMER in the last 72 hours. Hgb A1c  Recent Labs  02/02/16 0611  HGBA1C 8.6*   Lipid Profile  Recent Labs  02/02/16 0611  CHOL 147  HDL 30*  LDLCALC 98  TRIG 95  CHOLHDL 4.9   Thyroid function studies  Recent Labs  02/01/16 1642  TSH 0.644   Anemia work up No results for input(s): VITAMINB12, FOLATE, FERRITIN, TIBC, IRON, RETICCTPCT in the last 72 hours. Urinalysis    Component Value Date/Time   COLORURINE YELLOW 02/01/2016 1316   APPEARANCEUR CLEAR 02/01/2016 1316   APPEARANCEUR Hazy 11/13/2013 1152   LABSPEC 1.011 02/01/2016 1316   LABSPEC 1.006 11/13/2013 1152   PHURINE 5.0 02/01/2016 1316   GLUCOSEU NEGATIVE 02/01/2016 1316   GLUCOSEU Negative 11/13/2013 1152   HGBUR NEGATIVE 02/01/2016 1316   BILIRUBINUR NEGATIVE 02/01/2016 1316   BILIRUBINUR Negative 11/13/2013 1152   KETONESUR NEGATIVE 02/01/2016 1316   PROTEINUR NEGATIVE 02/01/2016 1316   UROBILINOGEN 0.2 05/19/2008 0327   NITRITE NEGATIVE 02/01/2016 1316   LEUKOCYTESUR NEGATIVE 02/01/2016 1316   LEUKOCYTESUR 3+ 11/13/2013 1152   Sepsis Labs Invalid input(s): PROCALCITONIN,  WBC,  LACTICIDVEN Microbiology Recent Results (from the past 240 hour(s))  Urine culture     Status: None   Collection Time: 02/01/16  1:16 PM  Result Value Ref Range Status   Specimen Description URINE, CATHETERIZED  Final   Special Requests NONE  Final   Culture NO GROWTH  Final   Report Status 02/02/2016 FINAL  Final  MRSA PCR Screening     Status: None   Collection Time: 02/01/16  4:21 PM  Result Value Ref Range Status   MRSA by PCR NEGATIVE NEGATIVE Final     Comment:        The GeneXpert MRSA Assay (FDA approved for NASAL specimens only), is one component of a comprehensive MRSA colonization surveillance program. It is not intended to diagnose MRSA infection nor to guide or monitor treatment for MRSA infections.   Culture, blood (routine x 2) Call MD if unable to obtain prior to antibiotics being given     Status: None (Preliminary result)   Collection Time: 02/02/16  2:49 PM  Result Value Ref Range Status   Specimen Description BLOOD BLOOD RIGHT HAND  Final   Special Requests IN PEDIATRIC BOTTLE 1CC  Final   Culture NO GROWTH 2 DAYS  Final   Report Status PENDING  Incomplete  Culture, blood (routine x 2) Call MD if unable to obtain prior to antibiotics being given     Status: None (Preliminary result)   Collection Time: 02/02/16  2:50 PM  Result Value Ref Range Status   Specimen Description BLOOD BLOOD RIGHT HAND  Final   Special Requests IN PEDIATRIC BOTTLE 1CC  Final   Culture NO GROWTH 2 DAYS  Final   Report Status PENDING  Incomplete     Time coordinating discharge: Over 30 minutes  SIGNED:   Alba Cory, MD  [facility] 02/04/2016, 2:09 PM Pager [phone]  If 7PM-7AM, please contact night-coverage [email or web address] Password [email or web address]

## 2016-02-04 NOTE — Progress Notes (Signed)
STROKE TEAM PROGRESS NOTE   SUBJECTIVE (INTERVAL HISTORY) Daughters are at bedside. No acute event overnight. Palliative care has worked with family and they now request hospice care which I think is very appropriate.     OBJECTIVE Temp:  [97.5 F (36.4 C)-99.7 F (37.6 C)] 99.7 F (37.6 C) (12/08 0809) Pulse Rate:  [80-89] 89 (12/08 0809) Cardiac Rhythm: Normal sinus rhythm (12/08 0702) Resp:  [16-18] 16 (12/08 0809) BP: (139-162)/(44-53) 162/53 (12/08 0809) SpO2:  [97 %-100 %] 97 % (12/08 0809)  CBC:   Recent Labs Lab 02/01/16 0710 02/01/16 0719 02/03/16 0449  WBC 15.0*  --  12.6*  NEUTROABS 11.6*  --   --   HGB 12.5 14.3 11.2*  HCT 38.6 42.0 36.7  MCV 72.4*  --  75.4*  PLT 172  --  190    Basic Metabolic Panel:   Recent Labs Lab 02/01/16 0710 02/01/16 0719 02/03/16 0449  NA 136 137 144  K 5.1 5.1 4.6  CL 104 104 114*  CO2 19*  --  21*  GLUCOSE 185* 191* 116*  BUN 42* 41* 27*  CREATININE 1.65* 1.70* 1.34*  CALCIUM 9.0  --  8.5*    Lipid Panel:     Component Value Date/Time   CHOL 147 02/02/2016 0611   TRIG 95 02/02/2016 0611   HDL 30 (L) 02/02/2016 0611   CHOLHDL 4.9 02/02/2016 0611   VLDL 19 02/02/2016 0611   LDLCALC 98 02/02/2016 0611   HgbA1c:  Lab Results  Component Value Date   HGBA1C 8.6 (H) 02/02/2016   Urine Drug Screen: No results found for: LABOPIA, COCAINSCRNUR, LABBENZ, AMPHETMU, THCU, LABBARB    IMAGING I have personally reviewed the radiological images below and agree with the radiology interpretations.  Dg Chest Port 1 View 02/01/2016 Poor inspiration.  No active infiltrate or effusion.   Ct Head Code Stroke W/o Cm 02/01/2016  1. Acute/subacute nonhemorrhagic infarct involving the right frontal operculum and lentiform nucleus. 2. Large remote left MCA territory infarct. 3. Left sphenoid sinus opacification. 4. Polyp or mucous retention cyst in the left maxillary sinus 5. ASPECTS is 7/10   2D Echocardiogram  - Left  ventricle: The cavity size was normal. Wall thickness was increased in a pattern of mild LVH. There was focal basal hypertrophy. Systolic function was normal. The estimated ejection fraction was in the range of 55% to 60%. Wall motion was normal; there were no regional wall motion abnormalities. Doppler parameters are consistent with abnormal left ventricular relaxation (grade 1 diastolic dysfunction).  Carotid Doppler   1-39% right ICA plaquing. Vertebral artery flow is antegrade.  Left carotid not imaged secondary to patient positioning to allow mucous to drain from mouth and constant coughing.   EEG This EEG demonstrated no focal, hemispheric, or lateralizing features.  There was no epileptiform activity recorded.  There was slowing of electrocerebral activity which can be seen in a wide variety of encephalopathic states including those of a toxic, metabolic, or degenerative nature.  The patient, however, was very drowsy during the recording and most of the recording was spent in sleep.  Correlate clinically.   PHYSICAL EXAM  Temp:  [97.5 F (36.4 C)-99.7 F (37.6 C)] 99.7 F (37.6 C) (12/08 0809) Pulse Rate:  [80-89] 89 (12/08 0809) Resp:  [16-18] 16 (12/08 0809) BP: (139-162)/(44-53) 162/53 (12/08 0809) SpO2:  [97 %-100 %] 97 % (12/08 0809)  General - Well nourished, well developed, eyes open but not responding to commands.  Ophthalmologic - Fundi  not visualized due to noncooperation.  Cardiovascular - Regular rate and rhythm.  Neuro - eyes open, but not responding to commands. Nonverbal, no seizure activity. Blinking to visual threat to the left but not to the right. Right facial droop, tongue in middle in mouth. RUE spastic plegia. RLE with pain mild withdraw at toes. LUE spontaneous movement, at least 3/5 on pain stimulation, LLE 2+/5 on pain stimulation. Bilateral babinski positive. DTR 3+ on the right. Sensation, coordination and gait not tested.    ASSESSMENT/PLAN Mia Calhoun is a 75 y.o. female with history of prior stroke with persistent right-sided weakness in 1996, chronic kidney disease stage III, chronic diastolic heart failure on diuretics, COPD, diabetes on insulin, iron deficiency anemia, hypertension, dyslipidemia and hypothyroidism presenting with R gaze preference. She did not receive IV t-PA due to unclear LKW.   Stroke:  R MCA infarct in setting of previous large L MCA infarct, new infarct felt to be embolic secondary to unknown source  CT small to moderate sized right MCA infarct, old left large MCA infarct  Carotid Doppler unremarkable right ICA, but left ICA not imaged due to noncooperation  2D Echo  EF 55-60%. No source of embolus  EEG diffuse slowing and no seizure   LDL 98  HgbA1c 8.6  Lovenox 30 mg sq daily for VTE prophylaxis Diet NPO time specified  No antithrombotic listed on MAR prior to admission, now on aspirin 300 mg suppository daily. OK to discontinue in hospice.  Disposition:  Residential hospice  Bilateral hemisphere involved with poor condition at baseline, her prognosis is very poor, agree with hospice care.   Seizure  left sided seizure activity with AMS  Started on Vimpat 100mg  bid  EEG no seizure activity recorded  Continue vimpat  Dysphagia   Difficulty with secretion handling  High risk of aspiration pneumonia  NPO  Comfort care as per hospice   Hypertension  Stable  Hyperlipidemia  Home meds:  zocor  LDL 98, goal < 70  Diabetes, type I  HgbA1c 8.6, goal < 7.0  uncontrolled  Other Stroke Risk Factors  Advanced age  former smoker  Obesity, Body mass index is 31.33 kg/m.  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   1996 - left large MCA infarct   Chronic diastolic CHF  Other Active Problems  COPD  AKI  baseline wheelchair bound, not able to talk.  Hospital day # 3  Agree with hospice arrangement. Neurology will sign off. Please call with questions.  Thanks for the consult.  Marvel PlanJindong Taylah Dubiel, MD PhD Stroke Neurology 02/04/2016 2:42 PM     To contact Stroke Continuity provider, please refer to [email or web address]. After hours, contact General Neurology

## 2016-02-04 NOTE — Progress Notes (Signed)
Report given to hosp[facility] of [place]

## 2016-02-04 NOTE — Progress Notes (Signed)
Daily Progress Note   Patient Name: Mia Calhoun       Date: 02/04/2016 DOB: 1941/01/27  Age: 75 y.o. MRN#: [id number] Attending Physician: Elmarie Shiley, MD Primary Care Physician: Blanchie Serve, MD Admit Date: 02/01/2016  Reason for Consultation/Follow-up: Disposition, Establishing goals of care and Psychosocial/spiritual support  Subjective: Mia Calhoun is relatively unchanged compared to yesterday. She continues to have spontaneous and non-purposeful movement on her left, with no observed or elicited movement on her right side. She continues to open her eyes to voice, however she did not track me with her eyes today. I continue to hear secretion gurgling, with occasional coughing noted.   Length of Stay: 3  Current Medications: Scheduled Meds:  . aspirin  300 mg Rectal Daily   Or  . aspirin  325 mg Oral Daily  . ceFEPime (MAXIPIME) IV  1 g Intravenous Q24H  . chlorhexidine  15 mL Mouth Rinse BID  . enoxaparin (LOVENOX) injection  40 mg Subcutaneous Q24H  . insulin aspart  0-9 Units Subcutaneous Q4H  . lacosamide (VIMPAT) IV  100 mg Intravenous Q12H  . levothyroxine  25 mcg Intravenous QAC breakfast  . mouth rinse  15 mL Mouth Rinse q12n4p  . metronidazole  500 mg Intravenous Q8H  . vancomycin  1,250 mg Intravenous Q24H    Continuous Infusions: . sodium chloride      PRN Meds: polyvinyl alcohol  Physical Exam    Constitutional: Vital signs are normal. She appears lethargic. She has a sickly appearance.  Neck: Normal range of motion.  Pulmonary/Chest: Effort normal.  Gurgling heard, lungs coarse throughout and diminished at bases. Occasional coughing. Abdominal: Soft. Normal appearance. Bowel sounds throughout. Musculoskeletal:  Left side with spontaneous though non-purposeful movement against gravity. No  observed or ellicited movement on right side.  Neurological: She appears lethargic.  Does not follow commands, no purposeful movements, not tracking me with her eyes. Slight right facial droop.  Skin: Skin is warm and dry.  Psychiatric:  Non-intearctive        Vital Signs: BP (!) 162/53 (BP Location: Left Arm)   Pulse 89   Temp 99.7 F (37.6 C) (Oral)   Resp 16   Ht _0  (1.651 m)   Wt 85.4 kg (188 lb 4.4 oz)   SpO2 97%   BMI 31.33 kg/m  SpO2: SpO2: 97 % O2 Device: O2 Device: Nasal Cannula O2 Flow Rate: O2 Flow Rate (L/min): 2 L/min  Intake/output summary:  Intake/Output Summary (Last 24 hours) at 02/04/16 0858 Last data filed at 02/04/16 7096  Gross per 24 hour  Intake          2313.75 ml  Output                0 ml  Net          2313.75 ml   LBM: Last BM Date: 02/03/16 Baseline Weight: Weight: 85.4 kg (188 lb 4.4 oz) Most recent weight: Weight: 85.4 kg (188 lb 4.4 oz)       Palliative Assessment/Data: PPS 10%   Flowsheet Rows   Flowsheet Row Most Recent Value  Intake Tab  Referral Department  Hospitalist  Unit at Time of  Referral  Med/Surg Unit  Palliative Care Primary Diagnosis  Neurology  Date Notified  02/02/16  Palliative Care Type  New Palliative care  Reason for referral  Clarify Goals of Care  Date of Admission  02/01/16  Date first seen by Palliative Care  02/03/16  # of days Palliative referral response time  1 Day(s)  # of days IP prior to Palliative referral  1  Clinical Assessment  Psychosocial & Spiritual Assessment  Palliative Care Outcomes  Patient/Family meeting held?  Yes  Who was at the meeting?  Pt's three children  Palliative Care Outcomes  Provided psychosocial or spiritual support, Other (Comment) [Discussion on feeding tube and care trajectory]  Palliative Care follow-up planned  -- [Follow-up family meeting 12/8]      Patient Active Problem List   Diagnosis Date Noted  . Pneumonia due to infectious organism   . Goals of care,  counseling/discussion   . Palliative care encounter   . Acute cerebrovascular accident (CVA) ([place]) 02/01/2016  . New onset seizure ([place]) 02/01/2016  . Acute ischemic right MCA stroke ([place])   . Diabetes mellitus with complication ([place])   . Acute encephalopathy   . History of CVA with residual deficit 12/14/2015  . Aphasia as late effect of cerebrovascular accident (CVA) 12/14/2015  . Chronic constipation 08/12/2015  . 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 Emphysema of lung ([place]) 10/20/2014  . 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 Obstructive chronic bronchitis without exacerbation ([place]) 05/24/2014  . Chronic diastolic congestive heart failure ([place]) 03/26/2014  . Anemia, iron deficiency 03/26/2014  . Cataract 02/16/2014  . Insomnia 02/16/2014  . Gastroesophageal reflux disease without esophagitis 02/16/2014  . Hemiplegia affecting dominant side, post-stroke ([place]) 10/23/2013  . Nontoxic multinodular goiter 05/30/2013  . Severe nonproliferative diabetic retinopathy(362.06) 04/30/2013  . Chronic airway obstruction, not elsewhere classified 04/30/2013  . Peripheral angiopathy in diseases classified elsewhere ([place]) 04/30/2013  . Hypothyroidism 03/28/2013  . Essential hypertension, benign 03/28/2013  . GERD (gastroesophageal reflux disease) 03/28/2013  . Hyperlipidemia LDL goal <100 03/28/2013  . Other iron deficiency anemias 10/04/2012  . Senile osteoporosis 10/04/2012  . Benign hypertensive heart and kidney disease with heart failure and with chronic kidney disease stage I through stage IV, or unspecified(404.11) 10/04/2012  . Chronic kidney disease, stage III (moderate) 10/04/2012  . Diabetes mellitus type 2 in obese [facility]) 10/04/2012    Palliative Care Assessment & Plan   HPI: 75 y.o. female  with past medical history of aneurysm with clip in 1979, large left MCA infarct in 1996 with residual right sided weakness and aphasia, CKD stage 3, dCHF, COPD,  hypertension, dyslipidemia, hypothyroidism and DM admitted on 02/01/2016 with left arm and leg twitching and subsequent right gaze preference. Work-up revealed a subacute R MCA small-moderate infarct (felt to be embolic), seizures, and PNA.   At baseline she was not on a dysphagia diet and was able to feed herself with her left arm, she was nonambulatory but was able to stand and pivot to get into the chair, she was also incontinent of bowel and bladder and wears diapers at the facility.  Assessment: I met with pt's two daughters and son on 12/7 for an initial family meeting. We discussed their mother's current presentation and the option for placing a feeding tube. The feeding tube was housed within the broader conversation about goals of care, and understanding the difference between life prolonging vs quality improving interventions. They asked to consider this distinction and care trajectory overnight.  Today, I had another family meeting with pt's five children, nephew, and pastor. We reviewed the clinical information, and the options for care trajectory. After extensive discussion about quality of life and goals of care, the family came to consensus on [place]. As pt is not eating or drinking and has pneumonia from aspiration, I would expect her to live 2 weeks or less, and would benefit from the 24 hr care provided at residential hospice. I expect she will need aggressive symptom management surrounding her respiratory status, as she will likely have difficult secretion management and progressive respiratory distress.   Recommendations/Plan:  Plan for discharge to residential hospice; SW consulted to help facilitate this  I would continue fluids and abx until discharge; family has significant stress and anxiety about not placing feeding tube and concern for her suffering. If we stop fluid and abx I imagine this would cause considerable family distress. I did review that they would not  continue after discharge.  Goals of Care and Additional Recommendations:  Limitations on Scope of Treatment: Full Comfort Care  Code Status:  DNR  Prognosis:   < 2 weeks  Discharge Planning:  Hospice facility  Care plan was discussed with pt's five children, nephew, and pastor.   Thank you for allowing the Palliative Medicine Team to assist in the care of this patient.   Time In: 0900 Time Out: 1000 Total Time 60 minutes Prolonged Time Billed  yes       Greater than 50%  of this time was spent counseling and coordinating care related to the above assessment and plan.  Charlynn Court, NP Palliative Medicine Team Team Phone # [phone]

## 2016-02-07 LAB — CULTURE, BLOOD (ROUTINE X 2)
Culture: NO GROWTH
Culture: NO GROWTH

## 2016-02-28 DEATH — deceased

## 2017-01-12 IMAGING — CT CT HEAD CODE STROKE
3 series · 15 of 47 positions shown, 18 images · non-contrast
Comparison: CT head without contrast 05/19/2008.

CLINICAL DATA: Code stroke. New onset a aphasia with fixed gaze to
the right. Remote infarct with right-sided weakness.

EXAM:
CT HEAD WITHOUT CONTRAST
TECHNIQUE: Contiguous axial images were obtained from the base of the skull
through the vertex without intravenous contrast.

[Series 2: head 5.0 st · axial · 0.48mm/px · z∈[-122,+23]mm · 9 of 35 slices shown, 12 images]
[im 3/35  brain]
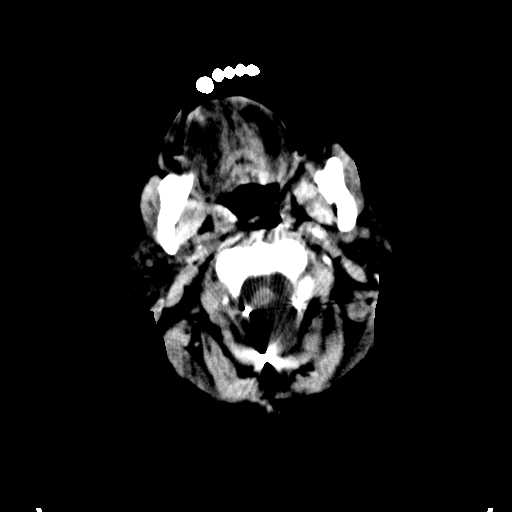
[im 3/35  bone]
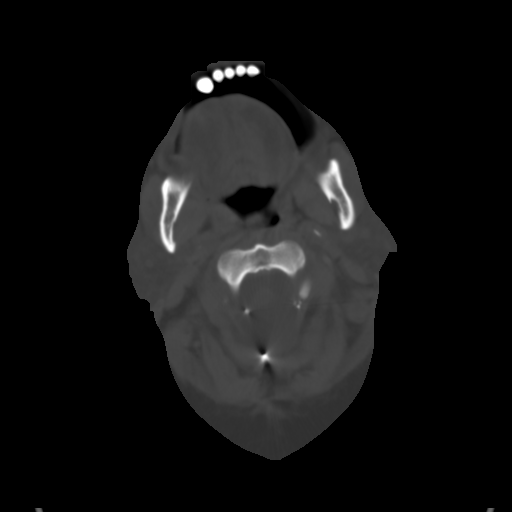
[im 6/35  brain]
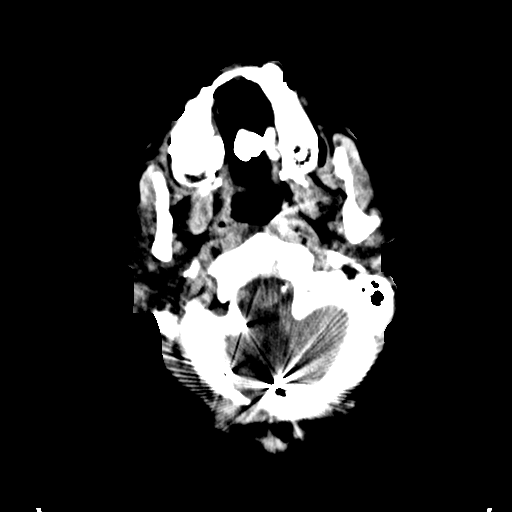
[im 10/35  brain]
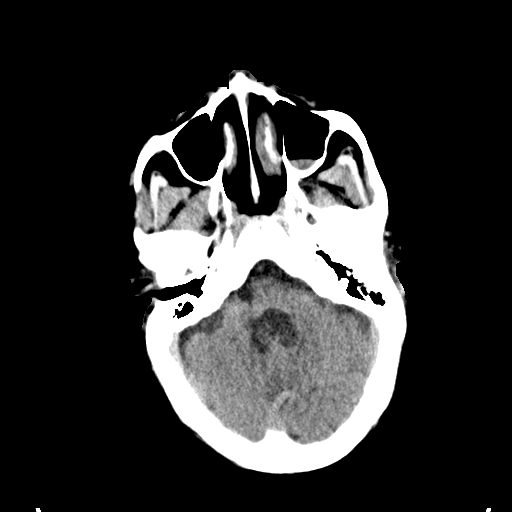
[im 13/35  brain]
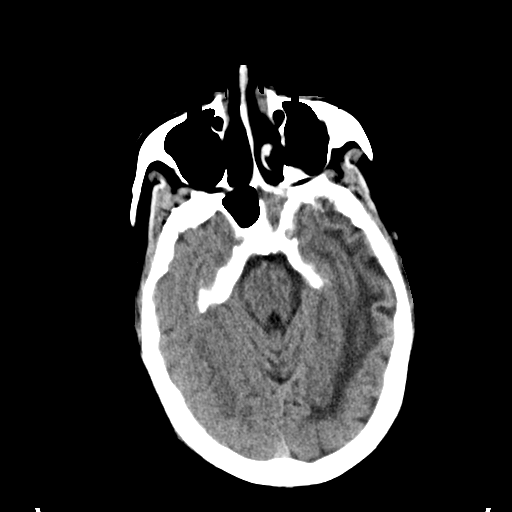
[im 18/35  brain]
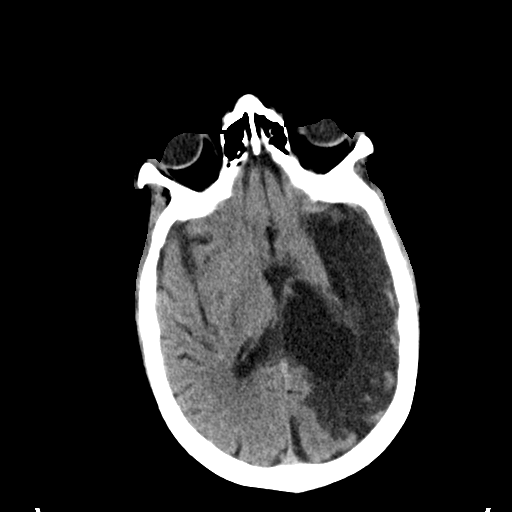
[im 18/35  bone]
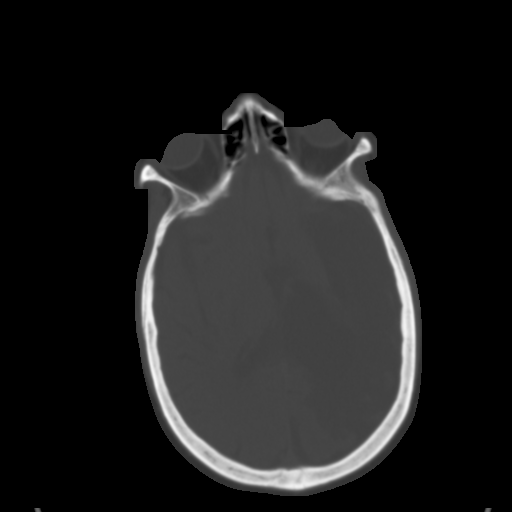
[im 22/35  brain]
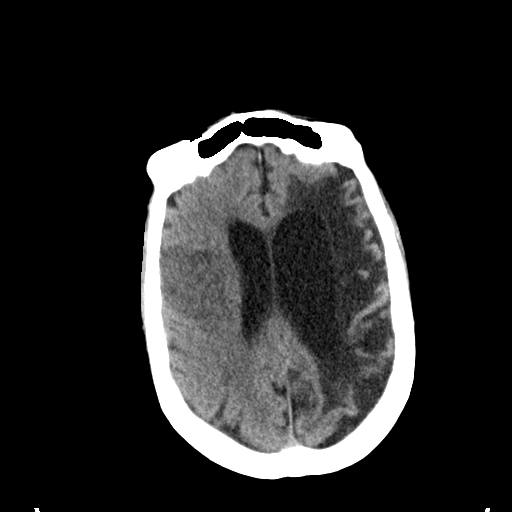
[im 25/35  brain]
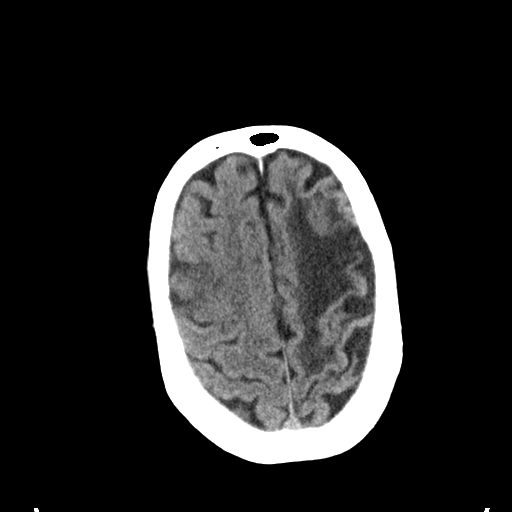
[im 29/35  brain]
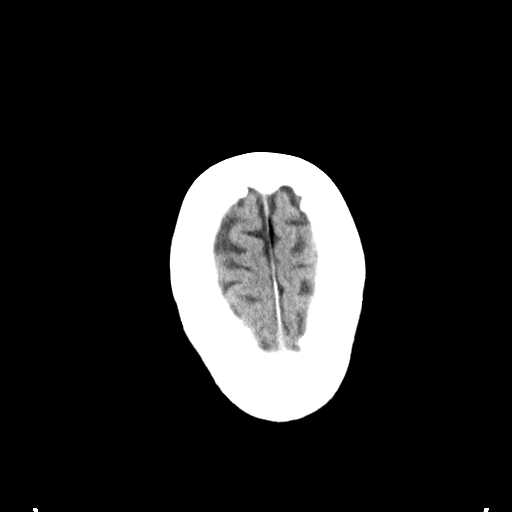
[im 32/35  brain]
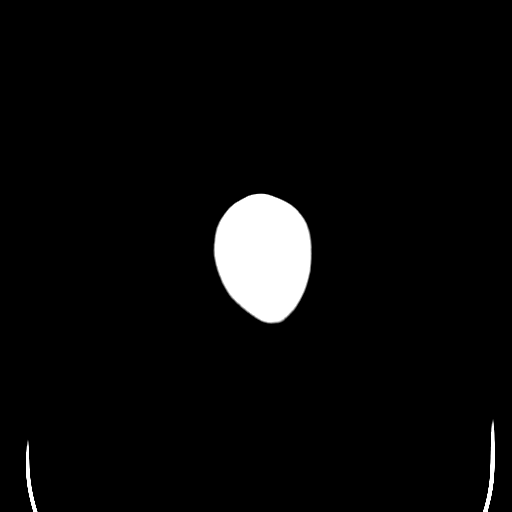
[im 32/35  bone]
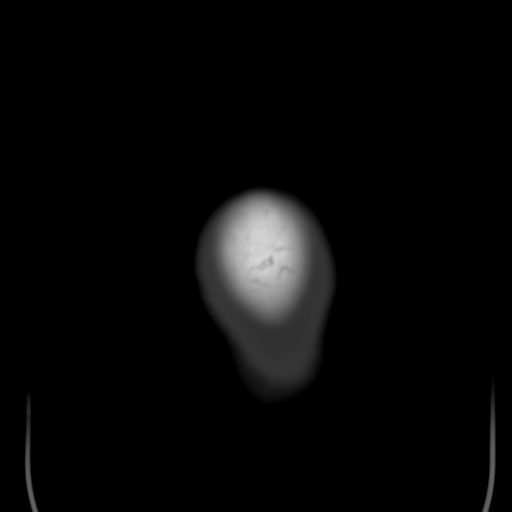

[Series 4: head 3.0 cor st · coronal · 0.34mm/px · 3 of 73 slices shown]
[im 25/73  brain]
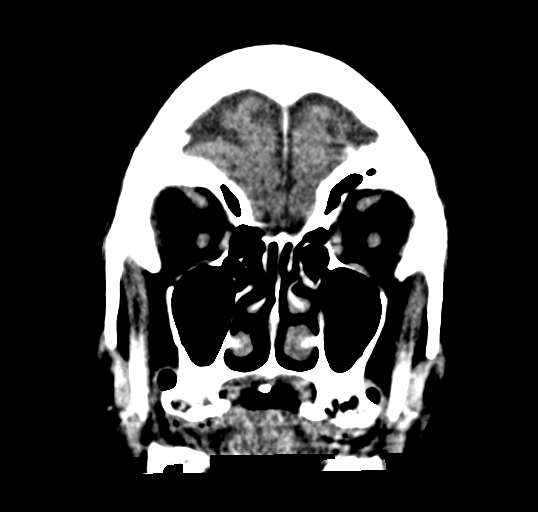
[im 33/73  brain]
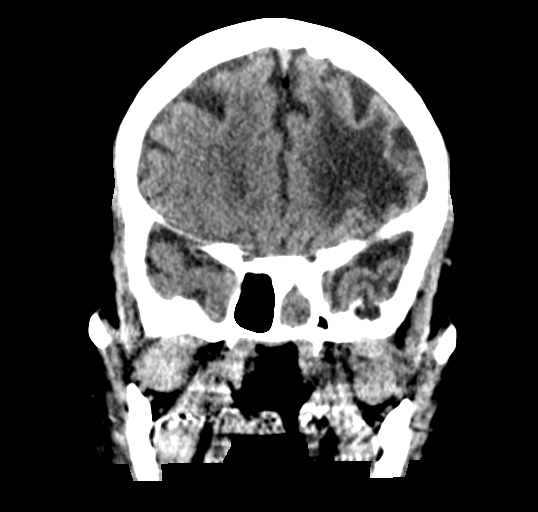
[im 41/73  brain]
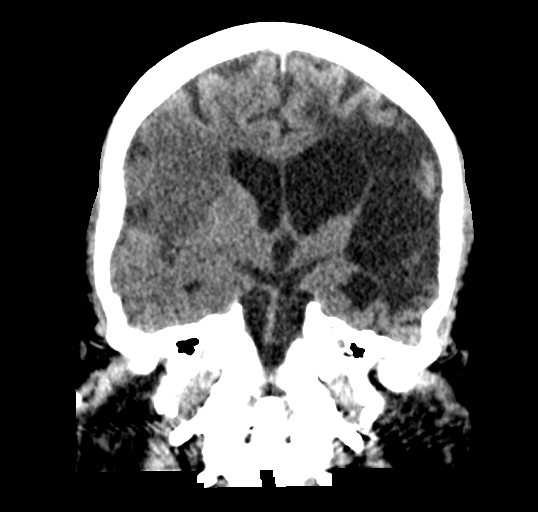

[Series 5: head 3.0 sag st · sagittal · 0.36mm/px · 3 of 67 slices shown]
[im 23/67  brain]
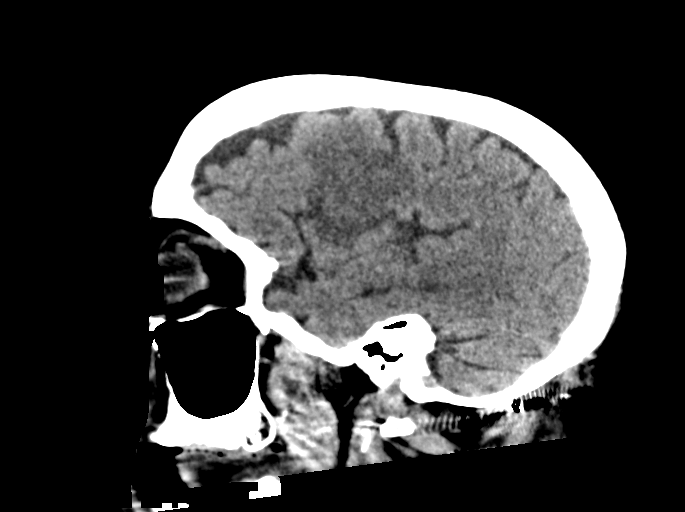
[im 34/67  brain]
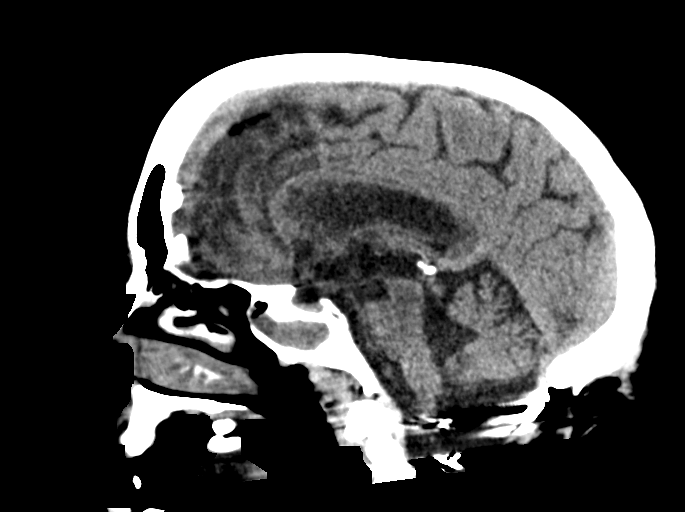
[im 45/67  brain]
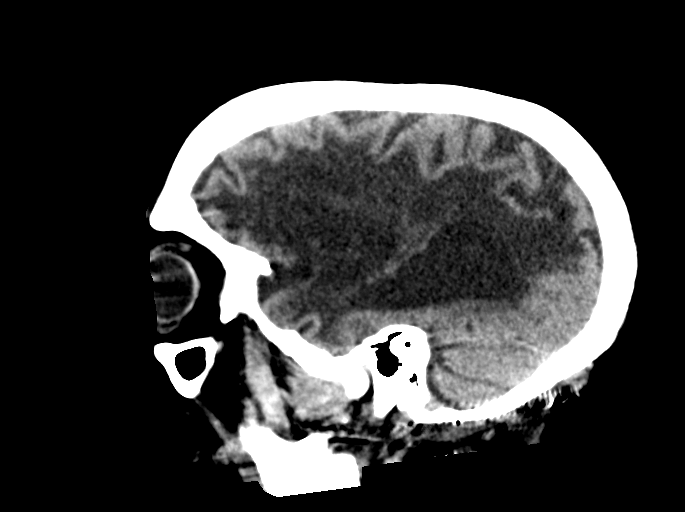

[15 of 47 positions shown; findings below may reference images not displayed]

FINDINGS: Brain: The a remote large left MCA territory infarct is again noted.

A new large right frontal lobe infarct involves the right frontal
operculum. There is some involvement of the lentiform nucleus. No
acute hemorrhage is present. There is some effacement of the sulci
within the infarct territory on the right.

Will layering degeneration is present along the left side the
brainstem. The cerebellum is intact.

Vascular: Atherosclerotic calcifications are present within the
cavernous internal carotid arteries bilaterally without a
significant hyperdense vessel.

Skull: Occipital craniotomy is again noted. The calvarium is
otherwise intact. No focal lytic or blastic lesions are present.

Sinuses/Orbits: The left sphenoid sinus is opacified. A polyp or
mucous retention cyst is present posteriorly within the left
maxillary sinus. Torus palatini are noted. The incisive foramen of
the maxilla is expanded.

ASPECTS ([place] Stroke Program Early CT Score)

- Ganglionic level infarction (caudate, lentiform nuclei, internal
capsule, insula, M1-M3 cortex): [DATE]

- Supraganglionic infarction (M4-M6 cortex): [DATE]

Total score (0-10 with 10 being normal): [DATE]
IMPRESSION: 1. Acute/subacute nonhemorrhagic infarct involving the right frontal
operculum and lentiform nucleus.
2. Large remote left MCA territory infarct.
3. Left sphenoid sinus opacification.
4. Polyp or mucous retention cyst in the left maxillary sinus
5. ASPECTS is [DATE]
These results were called by telephone at the time of interpretation
on 02/01/2016 at [DATE] to Dr. SEIDING, who verbally
acknowledged these results.

## 2017-01-12 IMAGING — CR DG CHEST 1V PORT
1 series · 1 of 1 positions shown · non-contrast
Comparison: CT chest screening scan of 04/08/2015 and chest x-ray
of 05/20/1998 and

CLINICAL DATA: Altered mental status

EXAM:
PORTABLE CHEST 1 VIEW

[portable]
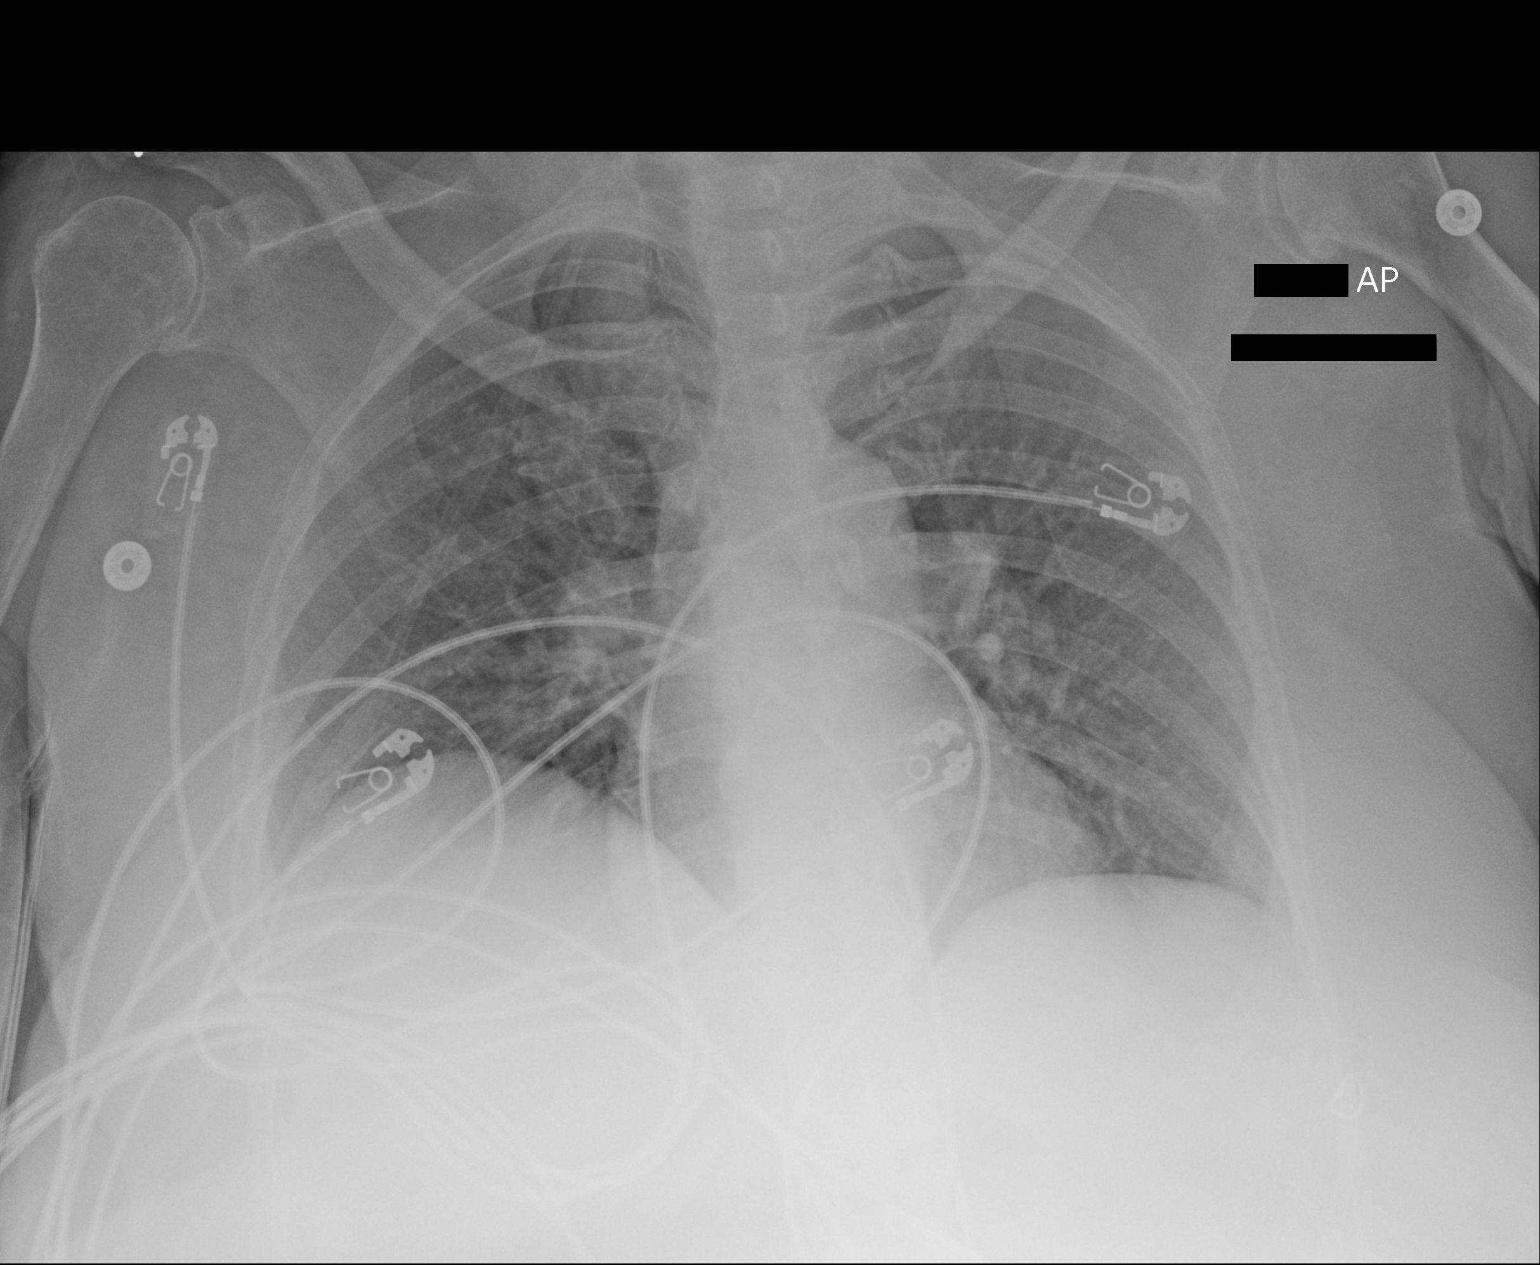

[1 of 1 positions shown; findings below may reference images not displayed]

FINDINGS: The lungs are poorly aerated with volume loss. No definite pneumonia
or effusion is seen. Heart size is within normal limits. No acute
bony abnormality is seen. There is degenerative change noted in the
shoulders. A tubing overlies the right chest.
IMPRESSION: Poor inspiration.  No active infiltrate or effusion.
